# Patient Record
Sex: Male | Born: 1955 | Race: White | Hispanic: No | Smoking: Current every day smoker
Health system: Southern US, Community
[De-identification: ages and names within clinical notes are randomized; demographics above are authoritative.]

## PROBLEM LIST (undated history)

## (undated) HISTORY — PX: DENTAL SURGERY: SHX609

---

## 2011-11-12 ENCOUNTER — Emergency Department (HOSPITAL_COMMUNITY)
Admission: EM | Admit: 2011-11-12 | Discharge: 2011-11-12 | Disposition: A | Discharge status: Home / Self Care | Payer: Self-pay | Attending: Emergency Medicine | Admitting: Emergency Medicine

## 2011-11-12 ENCOUNTER — Emergency Department (HOSPITAL_COMMUNITY): Payer: Self-pay

## 2011-11-12 DIAGNOSIS — R109 Unspecified abdominal pain: Secondary | ICD-10-CM | POA: Insufficient documentation

## 2011-11-12 DIAGNOSIS — R3911 Hesitancy of micturition: Secondary | ICD-10-CM | POA: Insufficient documentation

## 2011-11-12 DIAGNOSIS — N201 Calculus of ureter: Secondary | ICD-10-CM | POA: Insufficient documentation

## 2011-11-12 DIAGNOSIS — N289 Disorder of kidney and ureter, unspecified: Secondary | ICD-10-CM | POA: Insufficient documentation

## 2011-11-12 DIAGNOSIS — F17200 Nicotine dependence, unspecified, uncomplicated: Secondary | ICD-10-CM | POA: Insufficient documentation

## 2011-11-12 LAB — COMPREHENSIVE METABOLIC PANEL
Alkaline Phosphatase: 68 U/L (ref 39–117)
BUN: 17 mg/dL (ref 6–23)
CO2: 26 mEq/L (ref 19–32)
Chloride: 101 mEq/L (ref 96–112)
GFR calc Af Amer: 66 mL/min — ABNORMAL LOW (ref >90)
Glucose, Bld: 109 mg/dL — ABNORMAL HIGH (ref 70–99)
Potassium: 4 mEq/L (ref 3.5–5.1)
Total Bilirubin: 0.5 mg/dL (ref 0.3–1.2)

## 2011-11-12 LAB — URINALYSIS, ROUTINE W REFLEX MICROSCOPIC
Bilirubin Urine: NEGATIVE
Ketones, ur: NEGATIVE mg/dL
Nitrite: NEGATIVE
Urobilinogen, UA: 1 mg/dL (ref 0.0–1.0)
pH: 5.5 (ref 5.0–8.0)

## 2011-11-12 LAB — URINE MICROSCOPIC-ADD ON

## 2011-11-12 LAB — DIFFERENTIAL
Lymphocytes Relative: 14 % (ref 12–46)
Lymphs Abs: 1.4 10*3/uL (ref 0.7–4.0)
Monocytes Relative: 9 % (ref 3–12)
Neutro Abs: 7.2 10*3/uL (ref 1.7–7.7)
Neutrophils Relative %: 74 % (ref 43–77)

## 2011-11-12 LAB — LIPASE, BLOOD: Lipase: 17 U/L (ref 11–59)

## 2011-11-12 LAB — CBC
Hemoglobin: 14.5 g/dL (ref 13.0–17.0)
MCH: 26.4 pg (ref 26.0–34.0)
RBC: 5.49 MIL/uL (ref 4.22–5.81)

## 2011-11-12 MED ORDER — TAMSULOSIN HCL 0.4 MG PO CAPS: 0.4000 mg | ORAL_CAPSULE | Freq: Every day | ORAL | Status: DC

## 2011-11-12 MED ORDER — OXYCODONE-ACETAMINOPHEN 5-325 MG PO TABS: 1.0000 | ORAL_TABLET | ORAL | Status: AC | PRN

## 2011-11-12 MED ORDER — ONDANSETRON HCL 4 MG/2ML IJ SOLN
4.0000 mg | Freq: Once | INTRAMUSCULAR | Status: AC
  Administered 2011-11-12: 4 mg via INTRAVENOUS
  Filled 2011-11-12: qty 2

## 2011-11-12 MED ORDER — METOCLOPRAMIDE HCL 10 MG PO TABS: 10.0000 mg | ORAL_TABLET | Freq: Four times a day (QID) | ORAL | Status: AC | PRN

## 2011-11-12 MED ORDER — HYDROMORPHONE HCL PF 1 MG/ML IJ SOLN
1.0000 mg | Freq: Once | INTRAMUSCULAR | Status: AC
  Administered 2011-11-12: 1 mg via INTRAVENOUS
  Filled 2011-11-12: qty 1

## 2011-11-12 MED ORDER — KETOROLAC TROMETHAMINE 30 MG/ML IJ SOLN
30.0000 mg | Freq: Once | INTRAMUSCULAR | Status: AC
  Administered 2011-11-12: 30 mg via INTRAVENOUS
  Filled 2011-11-12: qty 1

## 2011-11-12 MED ORDER — SODIUM CHLORIDE 0.9 % IV SOLN
Freq: Once | INTRAVENOUS | Status: AC
  Administered 2011-11-12: 03:00:00 via INTRAVENOUS

## 2011-11-12 MED ORDER — NAPROXEN 500 MG PO TABS: 500.0000 mg | ORAL_TABLET | Freq: Two times a day (BID) | ORAL | Status: DC

## 2011-11-12 NOTE — ED Notes (Signed)
Pt unable to urinate at this time. Will continue to monitor.

## 2011-11-12 NOTE — ED Notes (Signed)
Pt stated that he came to the ED because he was having RLQ abdominal pain, radiating to R Lower back. Pt states pain is 8 out of 10. Pt stated that he has been burning while urinating , and LBM is unknown. Bowel sounds heard in all quadrants. No rebound tenderness. No nausea/vomiting. Will continue to monitor.

## 2011-11-12 NOTE — ED Notes (Signed)
Patient currently sitting up in bed; no respiratory or acute distress noted.  Lights turned off, per patient request.  Updated patient on plan of care; informed patient we are waiting on urinalysis results to come back.  Patient has no other questions or concerns at this time.  Will continue to monitor.

## 2011-11-12 NOTE — ED Notes (Signed)
The patient states that he was having right flank pain radiating to the RLQ not related to any strenuous activity.  Patient states that he has no personal hx of kidney stones, however he does have a family hx.

## 2011-11-12 NOTE — ED Notes (Signed)
Dr. Glick at bedside.  

## 2011-11-12 NOTE — ED Notes (Signed)
Patient currently sitting up in bed; no respiratory or acute distress noted.  Patient updated on plan of care; informed patient that we are currently waiting for lab results to come back and that patient is going to be transported to CT.  Patient has no other questions or concerns at this time.  Will continue to monitor.

## 2011-11-12 NOTE — ED Notes (Signed)
Patient given discharge paperwork; went over discharge instructions with patient.  Instructed patient to follow up with Urology, to have creatinine rechecked in 2-4 weeks, to increase fluid intake, and to take prescriptions as prescribed.  Patient instructed to return to the ED for new, worsening, or concerning symptoms.

## 2011-11-12 NOTE — ED Notes (Signed)
Placed patient on 2 liters O2; O2 increased from 88 to 100%.

## 2011-11-12 NOTE — ED Notes (Signed)
Patient transported to CT 

## 2011-11-12 NOTE — ED Notes (Signed)
Informed patient that we need a urine sample; patient states that he is unable to go at this time.

## 2011-11-12 NOTE — ED Notes (Signed)
Patient currently asleep in bed; no respiratory or acute distress noted.  Will continue to monitor. 

## 2011-11-12 NOTE — ED Notes (Signed)
Patient currently sitting up in bed; no respiratory or acute distress noted.  Patient updated on plan of care; informed patient that he is up for discharge and that we are currently waiting on discharge paperwork from [facility]P.  Patient has no other questions or concerns at this time.  Will continue to monitor.

## 2011-11-12 NOTE — ED Notes (Signed)
Pt stated that he was unable to give a urine sample at this time.

## 2011-11-12 NOTE — ED Provider Notes (Signed)
History     CSN: [id number]  Arrival date & time 11/12/11  0122   First MD Initiated Contact with Patient 11/12/11 [phone]      Chief Complaint  Patient presents with  . Abdominal Pain  . Flank Pain    (Consider location/radiation/quality/duration/timing/severity/associated sxs/prior treatment) HPI 55 year old male had onset at about 1 PM of pain in the right lower abdomen which has radiated up to the right flank. Pain is severe and he rates it at 8/10. Nothing makes it better and nothing makes it worse. He denies any nausea or vomiting. He denies any fever, chills, sweats. He has had some urinary hesitancy but no dysuria and no obvious hematuria. He has never had pain like this before. Pain is described as sharp. He is taken ibuprofen which has given him slight relief of pain. No past medical history on file.  Past Surgical History  Procedure Date  . Dental surgery     Family History  Problem Relation Age of Onset  . Parkinsonism Mother   . Diabetes Father   . Cholelithiasis Other     History  Substance Use Topics  . Smoking status: Current Everyday Smoker -- 0.5 packs/day for 20 years    Types: Cigarettes  . Smokeless tobacco: Not on file  . Alcohol Use: Yes      Review of Systems  Allergies  Review of patient's allergies indicates not on file.  Home Medications  No current outpatient prescriptions on file.  BP 146/90  Pulse 89  Temp(Src) 97.7 F (36.5 C) (Oral)  Resp 18  Ht 5\' 9"  (1.753 m)  Wt 150 lb (68.04 kg)  BMI 22.15 kg/m2  SpO2 99%  Physical Exam 54 year old male who is resting comfortably and is in no acute distress. Vital signs are significant for borderline hypertension with blood pressure 146/90. Oxygen saturation is 99% which is normal. Head is normocephalic and atraumatic. PERRLA, EOMI. Neck is supple without adenopathy. The lungs are clear without rales, wheezes, rhonchi. Heart has regular rate and rhythm without murmur. Abdomen is soft,  flat, with mild tenderness in the right lower cartilage and without any rebound or guarding. Peristalsis is diminished. There is mild right CVA tenderness. Extremities have no cyanosis or edema, full range of motion is present. Neurologic: Mental status is normal, cranial nerves are intact, there are no focal motor or sensory deficits. Psychiatric: No abnormalities of mood or affect. ED Course  Procedures (including critical care time)   Labs Reviewed  CBC  DIFFERENTIAL  COMPREHENSIVE METABOLIC PANEL  LIPASE, BLOOD  URINALYSIS, ROUTINE W REFLEX MICROSCOPIC   No results found. Results for orders placed during the hospital encounter of 11/12/11  CBC      Component Value Range   WBC 9.7  4.0 - 10.5 (K/uL)   RBC 5.49  4.22 - 5.81 (MIL/uL)   Hemoglobin 14.5  13.0 - 17.0 (g/dL)   HCT 86.5  78.4 - 69.6 (%)   MCV 78.3  78.0 - 100.0 (fL)   MCH 26.4  26.0 - 34.0 (pg)   MCHC 33.7  30.0 - 36.0 (g/dL)   RDW 29.5  28.4 - 13.2 (%)   Platelets 223  150 - 400 (K/uL)  DIFFERENTIAL      Component Value Range   Neutrophils Relative 74  43 - 77 (%)   Neutro Abs 7.2  1.7 - 7.7 (K/uL)   Lymphocytes Relative 14  12 - 46 (%)   Lymphs Abs 1.4  0.7 - 4.0 (K/uL)  Monocytes Relative 9  3 - 12 (%)   Monocytes Absolute 0.9  0.1 - 1.0 (K/uL)   Eosinophils Relative 2  0 - 5 (%)   Eosinophils Absolute 0.2  0.0 - 0.7 (K/uL)   Basophils Relative 0  0 - 1 (%)   Basophils Absolute 0.0  0.0 - 0.1 (K/uL)  COMPREHENSIVE METABOLIC PANEL      Component Value Range   Sodium 136  135 - 145 (mEq/L)   Potassium 4.0  3.5 - 5.1 (mEq/L)   Chloride 101  96 - 112 (mEq/L)   CO2 26  19 - 32 (mEq/L)   Glucose, Bld 109 (*) 70 - 99 (mg/dL)   BUN 17  6 - 23 (mg/dL)   Creatinine, Ser 1.61 (*) 0.50 - 1.35 (mg/dL)   Calcium 9.3  8.4 - 09.6 (mg/dL)   Total Protein 6.4  6.0 - 8.3 (g/dL)   Albumin 3.6  3.5 - 5.2 (g/dL)   AST 22  0 - 37 (U/L)   ALT 17  0 - 53 (U/L)   Alkaline Phosphatase 68  39 - 117 (U/L)   Total Bilirubin 0.5   0.3 - 1.2 (mg/dL)   GFR calc non Af Amer 57 (*) >90 (mL/min)   GFR calc Af Amer 66 (*) >90 (mL/min)  LIPASE, BLOOD      Component Value Range   Lipase 17  11 - 59 (U/L)  URINALYSIS, ROUTINE W REFLEX MICROSCOPIC      Component Value Range   Color, Urine YELLOW  YELLOW    APPearance TURBID (*) CLEAR    Specific Gravity, Urine 1.029  1.005 - 1.030    pH 5.5  5.0 - 8.0    Glucose, UA NEGATIVE  NEGATIVE (mg/dL)   Hgb urine dipstick MODERATE (*) NEGATIVE    Bilirubin Urine NEGATIVE  NEGATIVE    Ketones, ur NEGATIVE  NEGATIVE (mg/dL)   Protein, ur NEGATIVE  NEGATIVE (mg/dL)   Urobilinogen, UA 1.0  0.0 - 1.0 (mg/dL)   Nitrite NEGATIVE  NEGATIVE    Leukocytes, UA NEGATIVE  NEGATIVE   URINE MICROSCOPIC-ADD ON      Component Value Range   Squamous Epithelial / LPF RARE  RARE    WBC, UA 3-6  <3 (WBC/hpf)   RBC / HPF 3-6  <3 (RBC/hpf)   Bacteria, UA FEW (*) RARE    Urine-Other MUCOUS PRESENT     Ct Abdomen Pelvis Wo Contrast  11/12/2011  *RADIOLOGY REPORT*  Clinical Data: Right flank pain, radiating to the right lower quadrant of the abdomen.  CT ABDOMEN AND PELVIS WITHOUT CONTRAST  Technique:  Multidetector CT imaging of the abdomen and pelvis was performed following the standard protocol without intravenous contrast.  Comparison: None.  Findings: Minimal bibasilar atelectasis is noted.  The liver and spleen are unremarkable in appearance.  The gallbladder is within normal limits.  The pancreas and adrenal glands are unremarkable.  There is mild right-sided hydronephrosis, with right-sided perinephric stranding and fluid, and prominence of the right ureter to the level of an obstructing distal stone measuring 4 x 2 mm at the right vesicoureteral junction.  The left kidney is unremarkable in appearance.  No nonobstructing renal stones are seen.  No free fluid is identified.  The small bowel is unremarkable in appearance.  The stomach is within normal limits.  No acute vascular abnormalities are  seen.  The appendix is normal in caliber and contains air, without evidence for appendicitis.  Scattered diverticulosis is noted along  the distal descending and proximal sigmoid colon, without evidence of diverticulitis.  The bladder is mildly distended; it appears mildly thick-walled, possibly reflecting mild cystitis.  The prostate remains normal in size.  A small right inguinal hernia is noted, containing only fat. The left testis is superiorly positioned, seen along the inferior inguinal canal.  No inguinal lymphadenopathy is seen.  No acute osseous abnormalities are identified.  IMPRESSION:  1.  Mild right-sided hydronephrosis, with right-sided perinephric stranding and fluid, and an obstructing 4 x 2 mm stone noted distally at the right vesicoureteral junction. 2.  Mildly thick-walled appearance of the bladder may reflect mild cystitis. 3.  Superior positioning of the left testis, seen along the inferior inguinal canal.  Suggest clinical correlation for incompletely descended testis versus transient retraction. 4.  Scattered diverticulosis along the distal descending and proximal sigmoid colon, without evidence of diverticulitis. 5.  Small right inguinal hernia, containing only fat.  Original Report Authenticated By: Tonia Ghent, M.D.      No diagnosis found.  He was given IV fluid, IV Dilaudid, IV Zofran, and IV Toradol with excellent relief of pain. His CT scan confirms a distal right ureteral calculus, but he also has a mild increase in his creatinine. He is advised of the creatinine and need to get followup blood work. He will be sent home with prescriptions for Percocet, Reglan, naproxen, and Flomax.  MDM  Right flank and right lower abdomen pain-probable renal colic.        Dione Booze, MD 11/12/11 [phone]

## 2011-11-12 NOTE — ED Notes (Signed)
Woke up patient to inform him that we need a urine sample; patient states that he will try.

## 2012-01-29 ENCOUNTER — Emergency Department: Payer: Self-pay | Admitting: Unknown Physician Specialty

## 2012-01-29 LAB — BASIC METABOLIC PANEL
Anion Gap: 11 (ref 7–16)
Chloride: 103 mmol/L (ref 98–107)
Co2: 26 mmol/L (ref 21–32)
Creatinine: 0.83 mg/dL (ref 0.60–1.30)

## 2012-01-29 LAB — CBC WITH DIFFERENTIAL/PLATELET
Eosinophil %: 0.9 %
HGB: 15.7 g/dL (ref 13.0–18.0)
Lymphocyte #: 1.3 10*3/uL (ref 1.0–3.6)
MCV: 80 fL (ref 80–100)
Monocyte %: 5.9 %
Neutrophil #: 8.4 10*3/uL — ABNORMAL HIGH (ref 1.4–6.5)
RBC: 5.96 10*6/uL — ABNORMAL HIGH (ref 4.40–5.90)

## 2012-05-19 ENCOUNTER — Emergency Department (HOSPITAL_COMMUNITY)
Admission: EM | Admit: 2012-05-19 | Discharge: 2012-05-20 | Disposition: A | Discharge status: Home / Self Care | Payer: BC Managed Care – PPO | Attending: Emergency Medicine | Admitting: Emergency Medicine

## 2012-05-19 ENCOUNTER — Encounter (HOSPITAL_COMMUNITY): Payer: Self-pay | Admitting: *Deleted

## 2012-05-19 DIAGNOSIS — M7989 Other specified soft tissue disorders: Secondary | ICD-10-CM | POA: Insufficient documentation

## 2012-05-19 DIAGNOSIS — E119 Type 2 diabetes mellitus without complications: Secondary | ICD-10-CM | POA: Insufficient documentation

## 2012-05-19 DIAGNOSIS — G20 Parkinson's disease: Secondary | ICD-10-CM | POA: Insufficient documentation

## 2012-05-19 DIAGNOSIS — G20A1 Parkinson's disease without dyskinesia, without mention of fluctuations: Secondary | ICD-10-CM | POA: Insufficient documentation

## 2012-05-19 LAB — CBC WITH DIFFERENTIAL/PLATELET
Basophils Absolute: 0 10*3/uL (ref 0.0–0.1)
Eosinophils Relative: 4 % (ref 0–5)
Lymphocytes Relative: 24 % (ref 12–46)
Lymphs Abs: 2.1 10*3/uL (ref 0.7–4.0)
MCV: 77.4 fL — ABNORMAL LOW (ref 78.0–100.0)
Neutro Abs: 5.5 10*3/uL (ref 1.7–7.7)
Platelets: 232 10*3/uL (ref 150–400)
RBC: 5.54 MIL/uL (ref 4.22–5.81)
WBC: 8.6 10*3/uL (ref 4.0–10.5)

## 2012-05-19 LAB — COMPREHENSIVE METABOLIC PANEL
ALT: 16 U/L (ref 0–53)
AST: 17 U/L (ref 0–37)
Alkaline Phosphatase: 76 U/L (ref 39–117)
CO2: 26 mEq/L (ref 19–32)
Calcium: 9.2 mg/dL (ref 8.4–10.5)
Chloride: 104 mEq/L (ref 96–112)
GFR calc Af Amer: >90 mL/min (ref >90)
GFR calc non Af Amer: >90 mL/min (ref >90)
Glucose, Bld: 118 mg/dL — ABNORMAL HIGH (ref 70–99)
Potassium: 4.1 mEq/L (ref 3.5–5.1)
Sodium: 141 mEq/L (ref 135–145)
Total Bilirubin: 0.4 mg/dL (ref 0.3–1.2)

## 2012-05-19 NOTE — ED Notes (Signed)
The pt has pain and swelling in his rt lower leg since yesterday.  noknown injury.  The leg is larger then the lt leg and sl a darker color than the lt one..  No previous history

## 2012-05-20 ENCOUNTER — Ambulatory Visit (HOSPITAL_COMMUNITY)
Admission: RE | Admit: 2012-05-20 | Discharge: 2012-05-20 | Disposition: A | Discharge status: Home / Self Care | Payer: BC Managed Care – PPO | Source: Ambulatory Visit | Attending: Emergency Medicine | Admitting: Emergency Medicine

## 2012-05-20 ENCOUNTER — Emergency Department (HOSPITAL_COMMUNITY): Payer: BC Managed Care – PPO

## 2012-05-20 ENCOUNTER — Emergency Department (HOSPITAL_COMMUNITY)
Admission: EM | Admit: 2012-05-20 | Discharge: 2012-05-20 | Disposition: A | Discharge status: Home / Self Care | Payer: BC Managed Care – PPO | Attending: Emergency Medicine | Admitting: Emergency Medicine

## 2012-05-20 ENCOUNTER — Encounter (HOSPITAL_COMMUNITY): Payer: Self-pay | Admitting: *Deleted

## 2012-05-20 DIAGNOSIS — R599 Enlarged lymph nodes, unspecified: Secondary | ICD-10-CM | POA: Insufficient documentation

## 2012-05-20 DIAGNOSIS — M25469 Effusion, unspecified knee: Secondary | ICD-10-CM | POA: Insufficient documentation

## 2012-05-20 DIAGNOSIS — M7989 Other specified soft tissue disorders: Secondary | ICD-10-CM

## 2012-05-20 DIAGNOSIS — S93409A Sprain of unspecified ligament of unspecified ankle, initial encounter: Secondary | ICD-10-CM | POA: Insufficient documentation

## 2012-05-20 DIAGNOSIS — M79609 Pain in unspecified limb: Secondary | ICD-10-CM

## 2012-05-20 DIAGNOSIS — X58XXXA Exposure to other specified factors, initial encounter: Secondary | ICD-10-CM | POA: Insufficient documentation

## 2012-05-20 DIAGNOSIS — M79606 Pain in leg, unspecified: Secondary | ICD-10-CM

## 2012-05-20 MED ORDER — PREDNISONE 20 MG PO TABS
60.0000 mg | ORAL_TABLET | Freq: Once | ORAL | Status: AC
  Administered 2012-05-20: 60 mg via ORAL
  Filled 2012-05-20: qty 3

## 2012-05-20 MED ORDER — OXYCODONE-ACETAMINOPHEN 5-325 MG PO TABS: 1.0000 | ORAL_TABLET | Freq: Four times a day (QID) | ORAL | Status: AC | PRN

## 2012-05-20 MED ORDER — ENOXAPARIN SODIUM 100 MG/ML ~~LOC~~ SOLN
1.0000 mg/kg | Freq: Once | SUBCUTANEOUS | Status: AC
  Administered 2012-05-20: 70 mg via SUBCUTANEOUS
  Filled 2012-05-20 (×3): qty 1

## 2012-05-20 MED ORDER — PREDNISONE 10 MG PO TABS: 50.0000 mg | ORAL_TABLET | Freq: Every day | ORAL | Status: DC

## 2012-05-20 MED ORDER — OXYCODONE-ACETAMINOPHEN 5-325 MG PO TABS
2.0000 | ORAL_TABLET | Freq: Once | ORAL | Status: AC
  Administered 2012-05-20: 2 via ORAL
  Filled 2012-05-20 (×2): qty 2

## 2012-05-20 MED ORDER — HYDROCODONE-ACETAMINOPHEN 5-500 MG PO TABS: 1.0000 | ORAL_TABLET | Freq: Four times a day (QID) | ORAL | Status: DC | PRN

## 2012-05-20 NOTE — Discharge Instructions (Signed)
Return to the hospital later this morning as directed for your outpatient study to test for blood clots

## 2012-05-20 NOTE — ED Notes (Signed)
Ortho at bedside applying knee sleeve and ankle splint.

## 2012-05-20 NOTE — ED Provider Notes (Signed)
History     CSN: [id number]  Arrival date & time 05/20/12  9147   First MD Initiated Contact with Patient 05/20/12 [phone]      Chief Complaint  Patient presents with  . Leg Pain    (Consider location/radiation/quality/duration/timing/severity/associated sxs/prior treatment) HPI  She presents to the emergency department for right knee and right ankle pain. He was seen in the emergency department last night for the right knee pain and a Doppler study was ordered to rule out DVT. The patient returns this morning for his Doppler study in the test was negative. He checked back in because he's had continued swelling and pain in the knee and now swelling, bruising, pain in the right ankle. The patient is a poor strainer and rides horses. He states that he did have an event this past weekend where he is up on his feet all day but does not remembering injuring himself. He states that the pain in his knees severe. He's never had any problems like this before and he does not have any pains in any other joints besides the ones listed above. He has not had any fevers, chills, nausea, vomiting, diarrhea. The patient's vital signs are stable he is in no acute distress at this time.  History reviewed. No pertinent past medical history.  Past Surgical History  Procedure Date  . Dental surgery     Family History  Problem Relation Age of Onset  . Parkinsonism Mother   . Diabetes Father   . Cholelithiasis Other     History  Substance Use Topics  . Smoking status: Current Everyday Smoker -- 0.5 packs/day for 20 years    Types: Cigarettes  . Smokeless tobacco: Not on file  . Alcohol Use: Yes      Review of Systems   HEENT: denies blurry vision or change in hearing PULMONARY: Denies difficulty breathing and SOB CARDIAC: denies chest pain or heart palpitations MUSCULOSKELETAL:  denies being unable to ambulate ABDOMEN AL: denies abdominal pain GU: denies loss of bowel or urinary  control NEURO: denies numbness and tingling in extremities SKIN: no new rashes PSYCH: patient denies anxiety or depression. NECK: Pt denies having neck pain     Allergies  Review of patient's allergies indicates no known allergies.  Home Medications   Current Outpatient Rx  Name Route Sig Dispense Refill  . HYDROCODONE-ACETAMINOPHEN 5-500 MG PO TABS Oral Take 1 tablet by mouth every 6 (six) hours as needed for pain. 12 tablet 0  . PREDNISONE 10 MG PO TABS Oral Take 5 tablets (50 mg total) by mouth daily. 5 tablet 0    BP 147/94  Pulse 114  Temp 98.5 F (36.9 C) (Oral)  Resp 18  Ht 5\' 9"  (1.753 m)  Wt 155 lb (70.308 kg)  BMI 22.89 kg/m2  SpO2 100%  Physical Exam  Nursing note and vitals reviewed. Constitutional: He appears well-developed and well-nourished. No distress.  HENT:  Head: Normocephalic and atraumatic.  Eyes: Pupils are equal, round, and reactive to light.  Neck: Normal range of motion. Neck supple.  Cardiovascular: Normal rate and regular rhythm.   Pulmonary/Chest: Effort normal.  Abdominal: Soft.  Musculoskeletal:       Right knee: He exhibits effusion. He exhibits normal range of motion, no swelling, no ecchymosis, no deformity, no laceration, no erythema, normal alignment, no LCL laxity and normal patellar mobility. tenderness found. Patellar tendon tenderness noted.       Right ankle: He exhibits swelling and ecchymosis. He exhibits  normal range of motion, no deformity, no laceration and normal pulse. tenderness. Achilles tendon normal.       Legs:      Medium knee effusion to left knee  Left ankle has significant ecchymosis and swelling. Good pulses bilaterally.   Neurological: He is alert.  Skin: Skin is warm and dry.    ED Course  Procedures (including critical care time)  Labs Reviewed - No data to display Dg Ankle Complete Right  05/20/2012  *RADIOLOGY REPORT*  Clinical Data: Right ankle bruising and swelling medially.  No known injury per  patient.  RIGHT ANKLE - COMPLETE 3+ VIEW  Comparison: None.  Findings: Medial soft tissue swelling.  No evidence of acute, subacute, or healed fractures.  Ankle mortise intact with well- preserved joint space.  No intrinsic osseous abnormalities.  No evidence of a significant joint effusion.  IMPRESSION: No osseous abnormality.  Original Report Authenticated By: Arnell Sieving, M.D.   Dg Knee Complete 4 Views Right  05/20/2012  *RADIOLOGY REPORT*  Clinical Data: Bruising and swelling to the right knee.  No known injuries per patient.  RIGHT KNEE - COMPLETE 4+ VIEW  Comparison: None.  Findings: No evidence of acute or subacute fracture or dislocation. Very mild medial compartment joint space narrowing.  Lateral and patellofemoral compartment joint spaces well preserved.  No intrinsic osseous abnormalities.  Small joint effusion.  IMPRESSION: No acute or subacute osseous abnormality.  Very mild medial compartment joint space narrowing.  Small joint effusion.  Original Report Authenticated By: Arnell Sieving, M.D.     1. Knee effusion   2. Ankle sprain       MDM  Pt xrays are none acute. Knee xray does show small effusion.  Pts pain most likely caused by injury although unknown. I have expressed how important it is for patient to follow-up with the Orthopedist referred to him for these injuries. He promises he will follow up. Pt given knee sleeve and ASO for ankle. He already as crutches. Prednisone 50mg  Tab given in ED for swelling, as well as percocet. Pt also given Rx for prednisone and Vicodin.  Pt has been advised of the symptoms that warrant their return to the ED. Patient has voiced understanding and has agreed to follow-up with the PCP or specialist.         Dorthula Matas, PA 05/20/12 1101

## 2012-05-20 NOTE — ED Notes (Signed)
Patient reports onset of pain in his right knee on Saturday.  He was seen here for same last night.  He returned for doppler study and wants to be rechecked due to increased pain and swelling in the knee.  He denies trauma

## 2012-05-20 NOTE — Discharge Instructions (Signed)
Ankle Sprain An ankle sprain is an injury to the strong, fibrous tissues (ligaments) that hold the bones of your ankle joint together.  CAUSES Ankle sprain usually is caused by a fall or by twisting your ankle. People who participate in sports are more prone to these types of injuries.  SYMPTOMS  Symptoms of ankle sprain include:  Pain in your ankle. The pain may be present at rest or only when you are trying to stand or walk.   Swelling.   Bruising. Bruising may develop immediately or within 1 to 2 days after your injury.   Difficulty standing or walking.  DIAGNOSIS  Your caregiver will ask you details about your injury and perform a physical exam of your ankle to determine if you have an ankle sprain. During the physical exam, your caregiver will press and squeeze specific areas of your foot and ankle. Your caregiver will try to move your ankle in certain ways. An X-ray exam may be done to be sure a bone was not broken or a ligament did not separate from one of the bones in your ankle (avulsion).  TREATMENT  Certain types of braces can help stabilize your ankle. Your caregiver can make a recommendation for this. Your caregiver may recommend the use of medication for pain. If your sprain is severe, your caregiver may refer you to a surgeon who helps to restore function to parts of your skeletal system (orthopedist) or a physical therapist. HOME CARE INSTRUCTIONS  Apply ice to your injury for 1 to 2 days or as directed by your caregiver. Applying ice helps to reduce inflammation and pain.  Put ice in a plastic bag.   Place a towel between your skin and the bag.   Leave the ice on for 15 to 20 minutes at a time, every 2 hours while you are awake.   Take over-the-counter or prescription medicines for pain, discomfort, or fever only as directed by your caregiver.   Keep your injured leg elevated, when possible, to lessen swelling.   If your caregiver recommends crutches, use them as  instructed. Gradually, put weight on the affected ankle. Continue to use crutches or a cane until you can walk without feeling pain in your ankle.   If you have a plaster splint, wear the splint as directed by your caregiver. Do not rest it on anything harder than a pillow the first 24 hours. Do not put weight on it. Do not get it wet. You may take it off to take a shower or bath.   You may have been given an elastic bandage to wear around your ankle to provide support. If the elastic bandage is too tight (you have numbness or tingling in your foot or your foot becomes cold and blue), adjust the bandage to make it comfortable.   If you have an air splint, you may blow more air into it or let air out to make it more comfortable. You may take your splint off at night and before taking a shower or bath.   Wiggle your toes in the splint several times per day if you are able.  SEEK MEDICAL CARE IF:   You have an increase in bruising, swelling, or pain.   Your toes feel cold.   Pain relief is not achieved with medication.  SEEK IMMEDIATE MEDICAL CARE IF: Your toes are numb or blue or you have severe pain. MAKE SURE YOU:   Understand these instructions.   Will watch your condition.     Will get help right away if you are not doing well or get worse.  Document Released: 11/05/2005 Document Revised: 10/25/2011 Document Reviewed: 06/09/2008 [facility] Patient Information 2012 [place], [place].Knee Effusion The medical term for having fluid in your knee is effusion. This is often due to an internal derangement of the knee. This means something is wrong inside the knee. Some of the causes of fluid in the knee may be torn cartilage, a torn ligament, or bleeding into the joint from an injury. Your knee is likely more difficult to bend and move. This is often because there is increased pain and pressure in the joint. The time it takes for recovery from a knee effusion depends on different factors, including:     Type of injury.   Your age.   Physical and medical conditions.   Rehabilitation Strategies.  How long you will be away from your normal activities will depend on what kind of knee problem you have and how much damage is present. Your knee has two types of cartilage. Articular cartilage covers the bone ends and lets your knee bend and move smoothly. Two menisci, thick pads of cartilage that form a rim inside the joint, help absorb shock and stabilize your knee. Ligaments bind the bones together and support your knee joint. Muscles move the joint, help support your knee, and take stress off the joint itself. CAUSES  Often an effusion in the knee is caused by an injury to one of the menisci. This is often a tear in the cartilage. Recovery after a meniscus injury depends on how much meniscus is damaged and whether you have damaged other knee tissue. Small tears may heal on their own with conservative treatment. Conservative means rest, limited weight bearing activity and muscle strengthening exercises. Your recovery may take up to 6 weeks.  TREATMENT  Larger tears may require surgery. Meniscus injuries may be treated during arthroscopy. Arthroscopy is a procedure in which your surgeon uses a small telescope like instrument to look in your knee. Your caregiver can make a more accurate diagnosis (learning what is wrong) by performing an arthroscopic procedure. If your injury is on the inner margin of the meniscus, your surgeon may trim the meniscus back to a smooth rim. In other cases your surgeon will try to repair a damaged meniscus with stitches (sutures). This may make rehabilitation take longer, but may provide better long term result by helping your knee keep its shock absorption capabilities. Ligaments which are completely torn usually require surgery for repair. HOME CARE INSTRUCTIONS  Use crutches as instructed.   If a brace is applied, use as directed.   Once you are home, an ice pack  applied to your swollen knee may help with discomfort and help decrease swelling.   Keep your knee raised (elevated) when you are not up and around or on crutches.   Only take over-the-counter or prescription medicines for pain, discomfort, or fever as directed by your caregiver.   Your caregivers will help with instructions for rehabilitation of your knee. This often includes strengthening exercises.   You may resume a normal diet and activities as directed.  SEEK MEDICAL CARE IF:   There is increased swelling in your knee.   You notice redness, swelling, or increasing pain in your knee.   An unexplained oral temperature above 102 F (38.9 C) develops.  SEEK IMMEDIATE MEDICAL CARE IF:   You develop a rash.   You have difficulty breathing.   You have any allergic  reactions from medications you may have been given.   There is severe pain with any motion of the knee.  MAKE SURE YOU:   Understand these instructions.   Will watch your condition.   Will get help right away if you are not doing well or get worse.  Document Released: 01/26/2004 Document Revised: 10/25/2011 Document Reviewed: 03/31/2008 [facility] Patient Information 2012 [place], [place].Ligament Sprain Ligaments are tough, fibrous tissues that hold bones together at the joints. A sprain can occur when a ligament is stretched. This injury may take several weeks to heal. HOME CARE INSTRUCTIONS   Rest the injured area for as long as directed by your caregiver. Then slowly start using the joint as directed by your caregiver and as the pain allows.   Keep the affected joint raised if possible to lessen swelling.   Apply ice for 15 to 20 minutes to the injured area every couple hours for the first half day, then 3 to 4 times per day for the first 48 hours. Put the ice in a plastic bag and place a towel between the bag of ice and your skin.   Wear any splinting, casting, or elastic bandage applications as instructed.    Only take over-the-counter or prescription medicines for pain, discomfort, or fever as directed by your caregiver. Do not use aspirin immediately after the injury unless instructed by your caregiver. Aspirin can cause increased bleeding and bruising of the tissues.   If you were given crutches, continue to use them as instructed and do not resume weight bearing on the affected extremity until instructed.  SEEK MEDICAL CARE IF:   Your bruising, swelling, or pain increases.   You have cold and numb fingers or toes if your arm or leg was injured.  SEEK IMMEDIATE MEDICAL CARE IF:   Your toes are numb or blue if your leg was injured.   Your fingers are numb or blue if your arm was injured.   Your pain is not responding to medicines and continues to stay the same or gets worse.  MAKE SURE YOU:   Understand these instructions.   Will watch your condition.   Will get help right away if you are not doing well or get worse.  Document Released: 11/02/2000 Document Revised: 10/25/2011 Document Reviewed: 08/31/2008 [facility] Patient Information 2012 [place], [place].

## 2012-05-20 NOTE — Progress Notes (Signed)
Right lower extremity venous duplex completed.  Preliminary report is negative for DVT, SVT, or a Baker's cyst in the right leg.  Negative for DVT in the left common femoral vein. 

## 2012-05-20 NOTE — ED Notes (Addendum)
Patient transported to X-ray by Ronne Binning, pt stable and ready for transport.

## 2012-05-20 NOTE — ED Provider Notes (Signed)
History     CSN: [id number]  Arrival date & time 05/19/12  2236   First MD Initiated Contact with Patient 05/20/12 0001      Chief Complaint  Patient presents with  . Leg Swelling    (Consider location/radiation/quality/duration/timing/severity/associated sxs/prior treatment) HPI Complains of right leg swelling onset yesterday with mild pain. Nothing makes symptoms better or worse. No injury no numbness no other associated symptoms discomfort is mild. No fever no other complaint History reviewed. No pertinent past medical history. Aspect history negative Past Surgical History  Procedure Date  . Dental surgery     Family History  Problem Relation Age of Onset  . Parkinsonism Mother   . Diabetes Father   . Cholelithiasis Other     History  Substance Use Topics  . Smoking status: Current Everyday Smoker -- 0.5 packs/day for 20 years    Types: Cigarettes  . Smokeless tobacco: Not on file  . Alcohol Use: Yes      Review of Systems  Constitutional: Negative.   HENT: Negative.   Respiratory: Negative.   Cardiovascular: Negative.   Gastrointestinal: Negative.   Musculoskeletal:       Swelling of right lower leg  Skin: Negative.   Neurological: Negative.   Hematological: Negative.   Psychiatric/Behavioral: Negative.     Allergies  Review of patient's allergies indicates no known allergies.  Home Medications  No current outpatient prescriptions on file.  BP 136/86  Pulse 104  Temp 98.4 F (36.9 C) (Oral)  Resp 18  SpO2 98%  Physical Exam  Nursing note and vitals reviewed. Constitutional: He appears well-developed and well-nourished.  HENT:  Head: Normocephalic and atraumatic.  Eyes: Conjunctivae are normal. Pupils are equal, round, and reactive to light.  Neck: Neck supple. No tracheal deviation present. No thyromegaly present.  Cardiovascular: Normal rate and regular rhythm.   No murmur heard. Pulmonary/Chest: Effort normal and breath sounds normal.    Abdominal: Soft. Bowel sounds are normal. He exhibits no distension. There is no tenderness.  Musculoskeletal: Normal range of motion. He exhibits edema. He exhibits no tenderness.       Right lower extremity ecchymotic at medial malleolus. Swollen at calf with minimal tenderness no cords no Homans sign DP pulse 2+  Neurological: He is alert. Coordination normal.       Gait normal  Skin: Skin is warm and dry. No rash noted.  Psychiatric: He has a normal mood and affect.    ED Course  Procedures (including critical care time)  Labs Reviewed  CBC WITH DIFFERENTIAL - Abnormal; Notable for the following:    MCV 77.4 (*)     MCH 25.8 (*)     All other components within normal limits  COMPREHENSIVE METABOLIC PANEL - Abnormal; Notable for the following:    Glucose, Bld 118 (*)     All other components within normal limits   No results found.   No diagnosis found.  Results for orders placed during the hospital encounter of 05/19/12  CBC WITH DIFFERENTIAL      Component Value Range   WBC 8.6  4.0 - 10.5 K/uL   RBC 5.54  4.22 - 5.81 MIL/uL   Hemoglobin 14.3  13.0 - 17.0 g/dL   HCT 78.2  95.6 - 21.3 %   MCV 77.4 (*) 78.0 - 100.0 fL   MCH 25.8 (*) 26.0 - 34.0 pg   MCHC 33.3  30.0 - 36.0 g/dL   RDW 08.6  57.8 - 46.9 %  Platelets 232  150 - 400 K/uL   Neutrophils Relative 64  43 - 77 %   Neutro Abs 5.5  1.7 - 7.7 K/uL   Lymphocytes Relative 24  12 - 46 %   Lymphs Abs 2.1  0.7 - 4.0 K/uL   Monocytes Relative 8  3 - 12 %   Monocytes Absolute 0.7  0.1 - 1.0 K/uL   Eosinophils Relative 4  0 - 5 %   Eosinophils Absolute 0.3  0.0 - 0.7 K/uL   Basophils Relative 0  0 - 1 %   Basophils Absolute 0.0  0.0 - 0.1 K/uL  COMPREHENSIVE METABOLIC PANEL      Component Value Range   Sodium 141  135 - 145 mEq/L   Potassium 4.1  3.5 - 5.1 mEq/L   Chloride 104  96 - 112 mEq/L   CO2 26  19 - 32 mEq/L   Glucose, Bld 118 (*) 70 - 99 mg/dL   BUN 13  6 - 23 mg/dL   Creatinine, Ser 1.61  0.50 -  1.35 mg/dL   Calcium 9.2  8.4 - 09.6 mg/dL   Total Protein 6.6  6.0 - 8.3 g/dL   Albumin 3.7  3.5 - 5.2 g/dL   AST 17  0 - 37 U/L   ALT 16  0 - 53 U/L   Alkaline Phosphatase 76  39 - 117 U/L   Total Bilirubin 0.4  0.3 - 1.2 mg/dL   GFR calc non Af Amer >90  >90 mL/min   GFR calc Af Amer >90  >90 mL/min   No results found.   MDM  No clinical signs of infection .-Clinically, DVT most likely diagnosis; no signs or symptoms of PE Plan Lovenox discharge Outpatient DVT study  Diagnosis swelling of right lower extremity         Doug Sou, MD 05/20/12 0454

## 2012-05-20 NOTE — ED Notes (Signed)
Discharged home with written and verbal instruction.  No questions or concerns at discharge. 

## 2012-05-20 NOTE — Progress Notes (Signed)
Orthopedic Tech Progress Note Patient Details:  Victor Berger November 30, 1955 [id number]  Ortho Devices Type of Ortho Device: Knee Sleeve;ASO Ortho Device/Splint Location: right LE Ortho Device/Splint Interventions: Application   Yosmar Ryker T 05/20/2012, 11:18 AM

## 2012-05-21 NOTE — ED Provider Notes (Signed)
Medical screening examination/treatment/procedure(s) were performed by non-physician practitioner and as supervising physician I was immediately available for consultation/collaboration.   Elenie Coven, MD 05/21/12 1315 

## 2012-07-09 ENCOUNTER — Emergency Department: Payer: Self-pay | Admitting: *Deleted

## 2012-07-30 ENCOUNTER — Emergency Department: Payer: Self-pay | Admitting: Emergency Medicine

## 2013-05-19 IMAGING — CR DG KNEE COMPLETE 4+V*R*
4 series · 4 of 4 positions shown · non-contrast
Comparison: None.

CLINICAL DATA: Bruising and swelling to the right knee.  No known
injuries per patient.

RIGHT KNEE - COMPLETE 4+ VIEW

[t knee ap right]
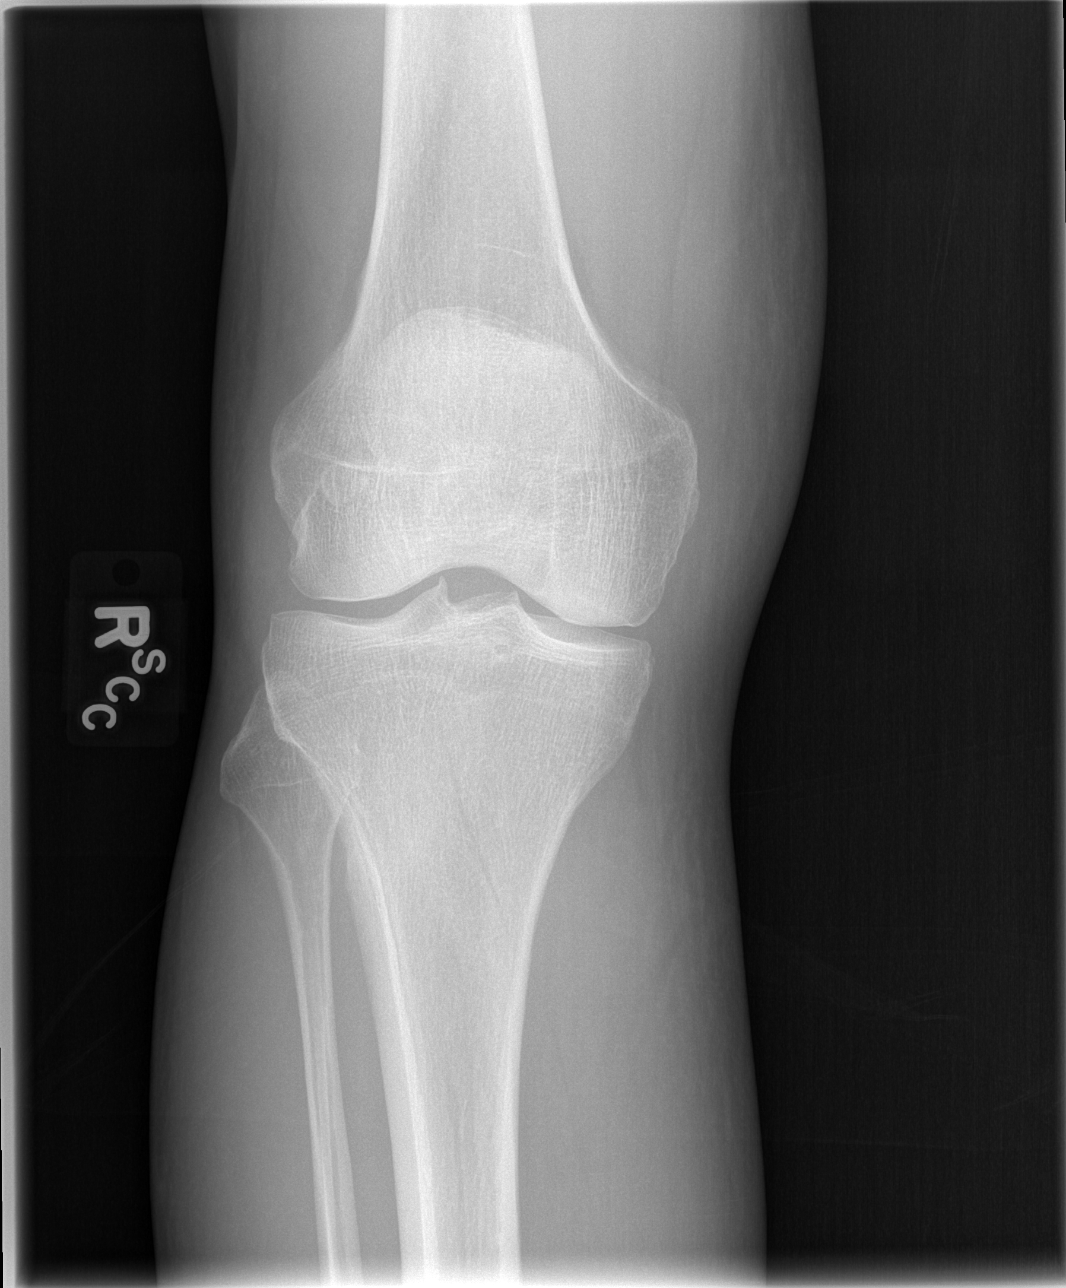

[t knee oblique right (1 of 2)]
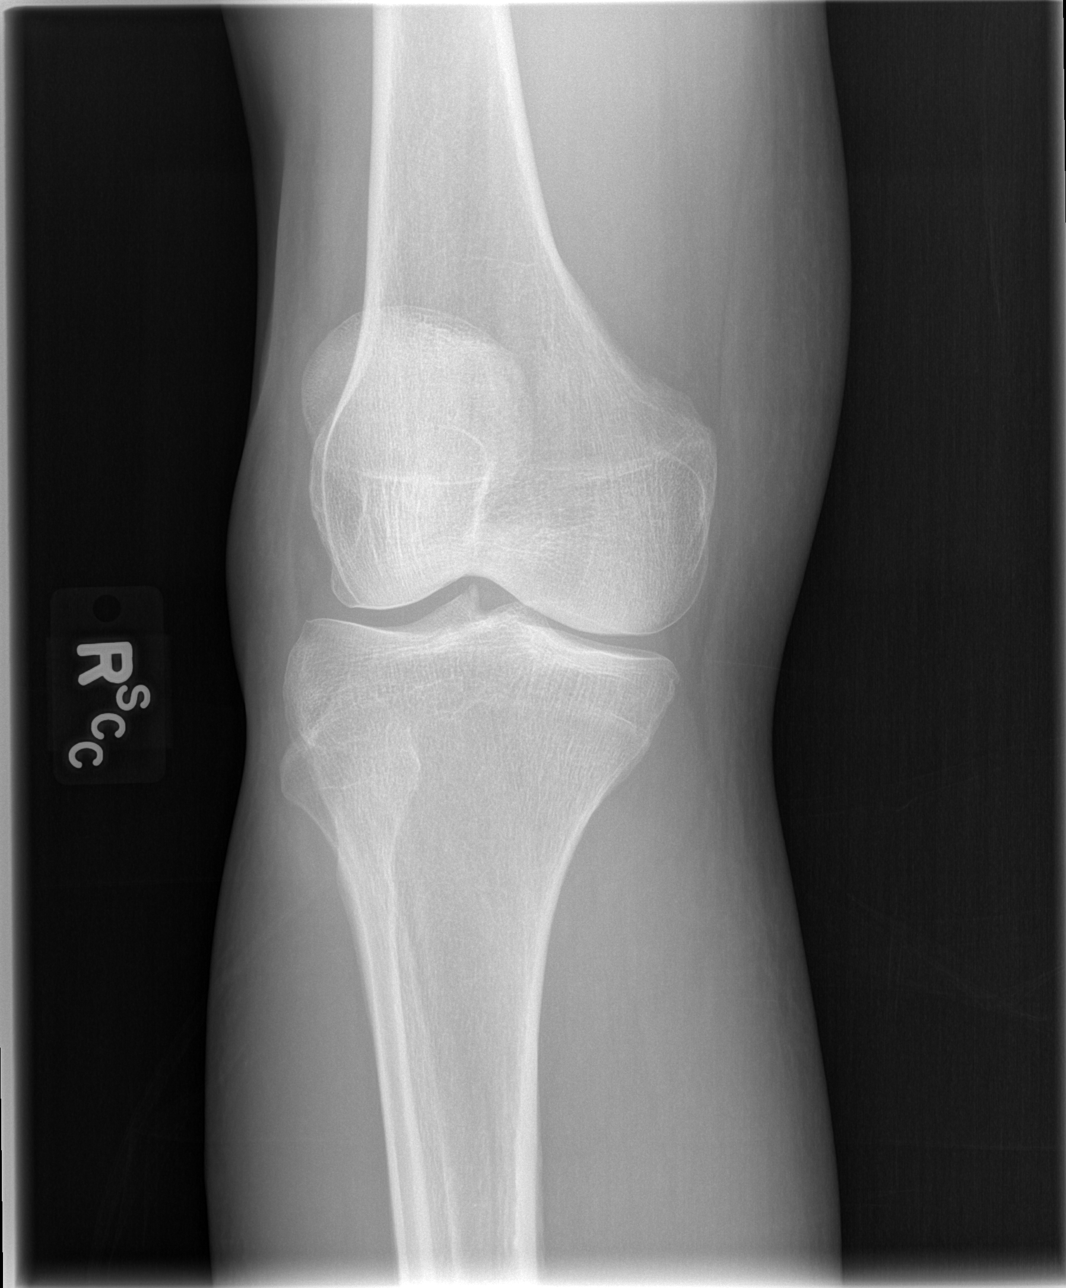

[t knee oblique right (2 of 2)]
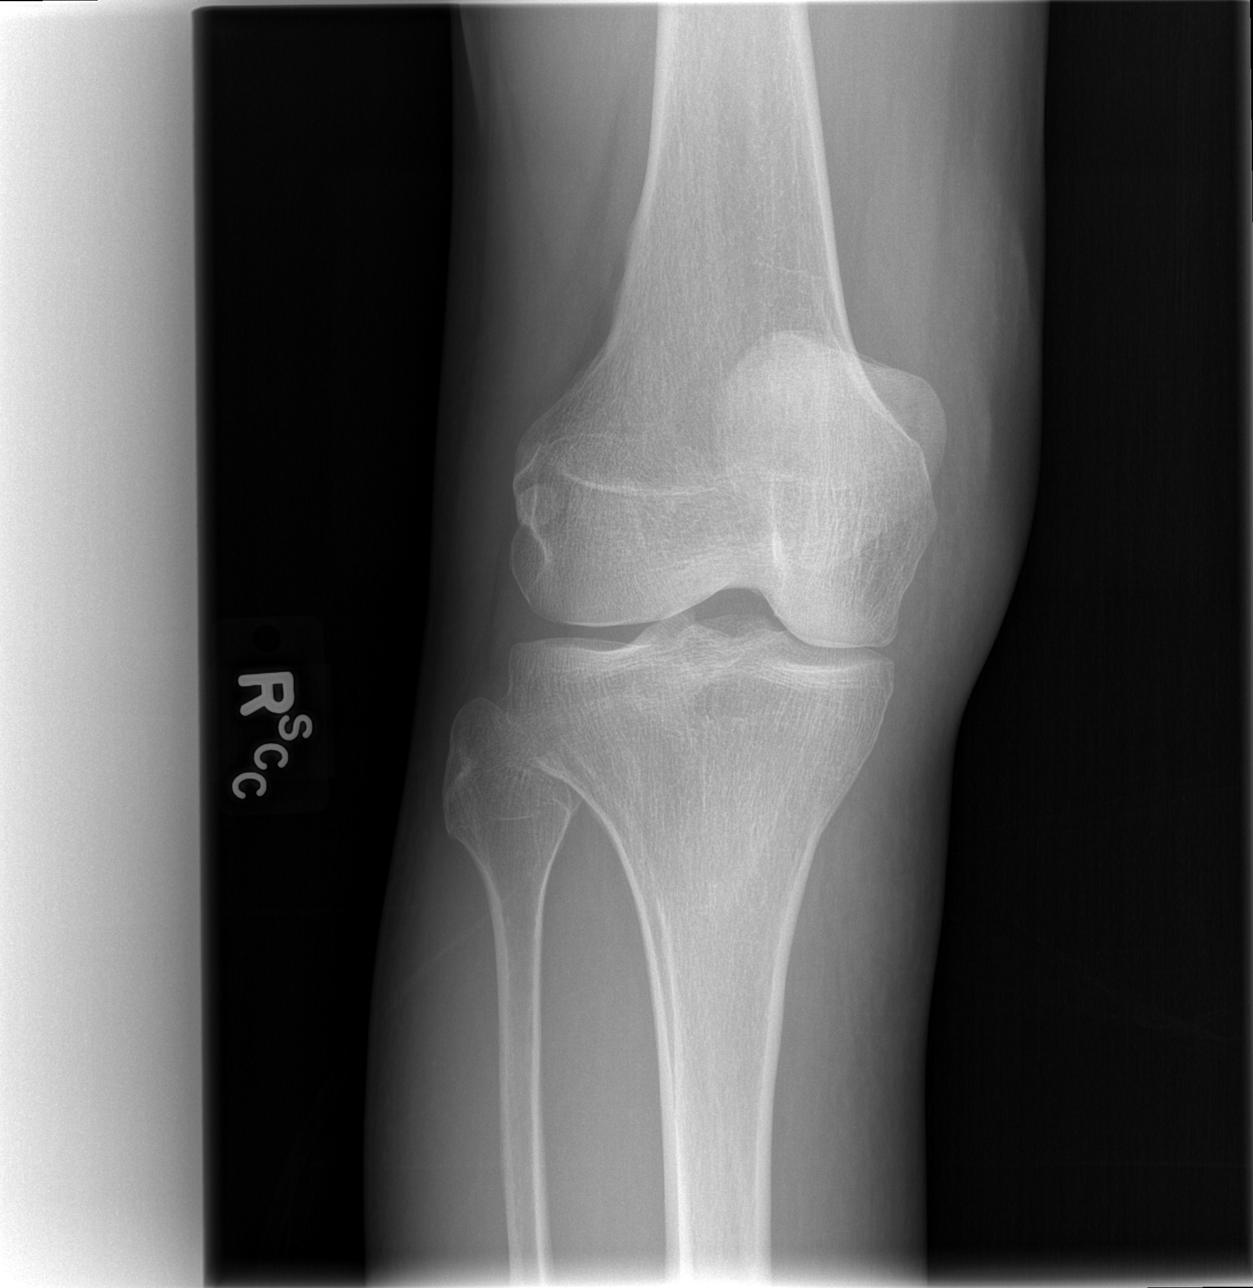

[t knee lat right]
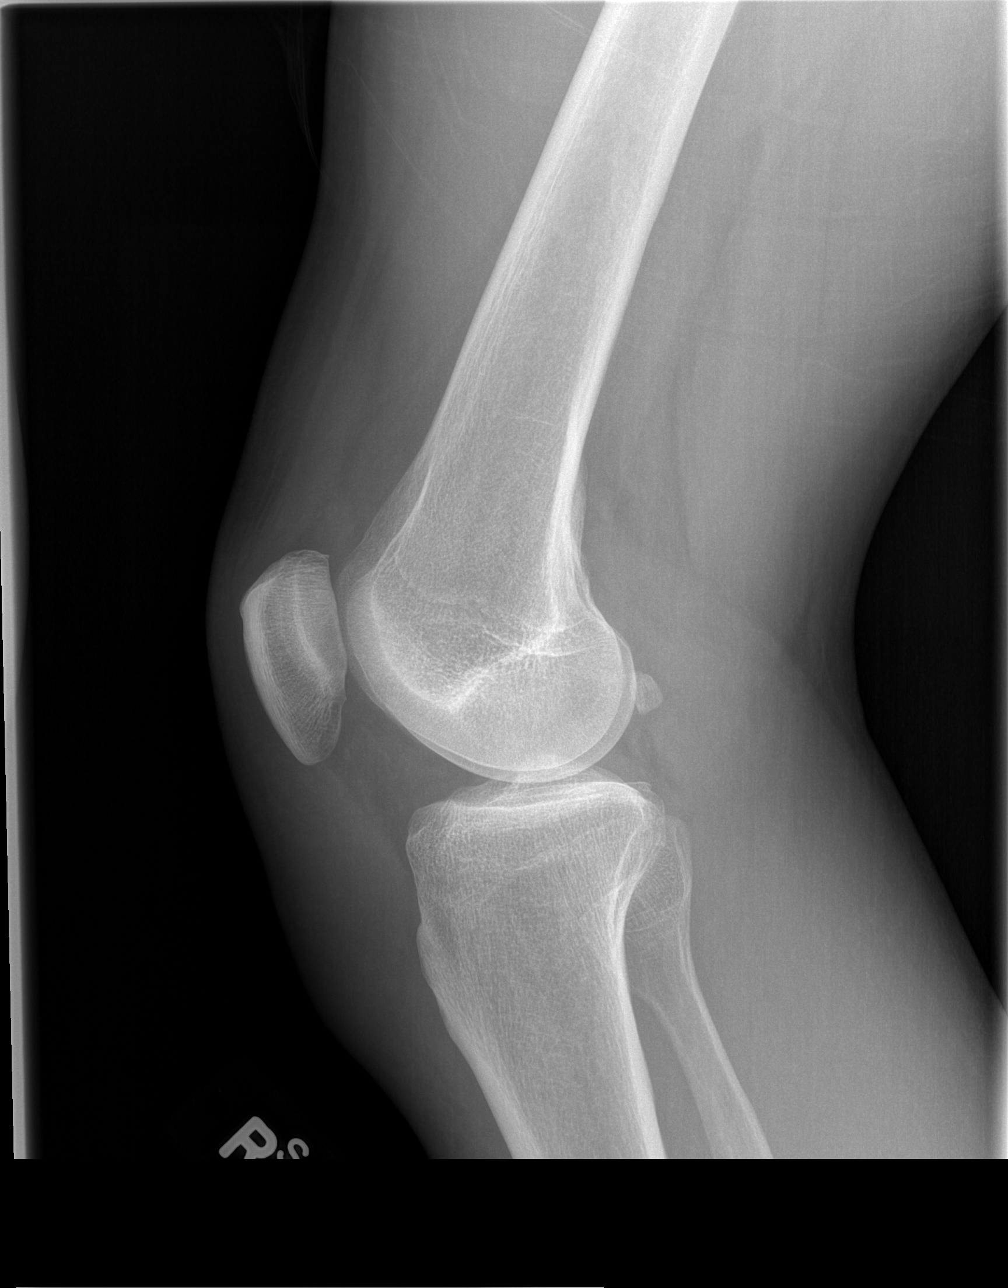

[4 of 4 positions shown; findings below may reference images not displayed]

FINDINGS: No evidence of acute or subacute fracture or dislocation.
Very mild medial compartment joint space narrowing.  Lateral and
patellofemoral compartment joint spaces well preserved.  No
intrinsic osseous abnormalities.  Small joint effusion.
IMPRESSION: No acute or subacute osseous abnormality.  Very mild medial
compartment joint space narrowing.  Small joint effusion.

## 2013-05-19 IMAGING — CR DG ANKLE COMPLETE 3+V*R*
3 series · 3 of 3 positions shown · non-contrast
Comparison: None.

CLINICAL DATA: Right ankle bruising and swelling medially.  No
known injury per patient.

RIGHT ANKLE - COMPLETE 3+ VIEW

[t ankle joint ap right]
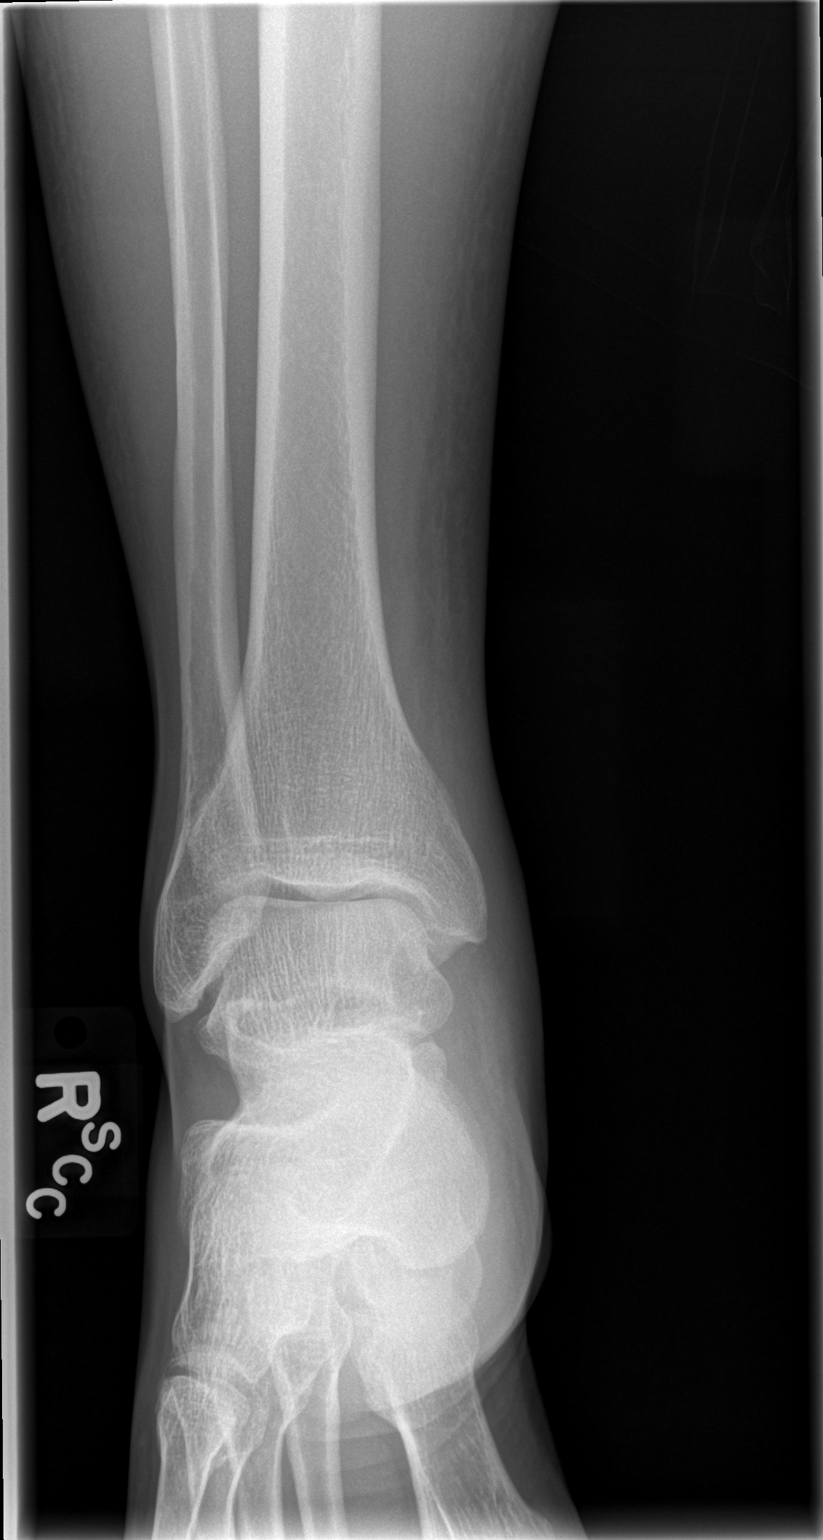

[t ankle joint oblique right]
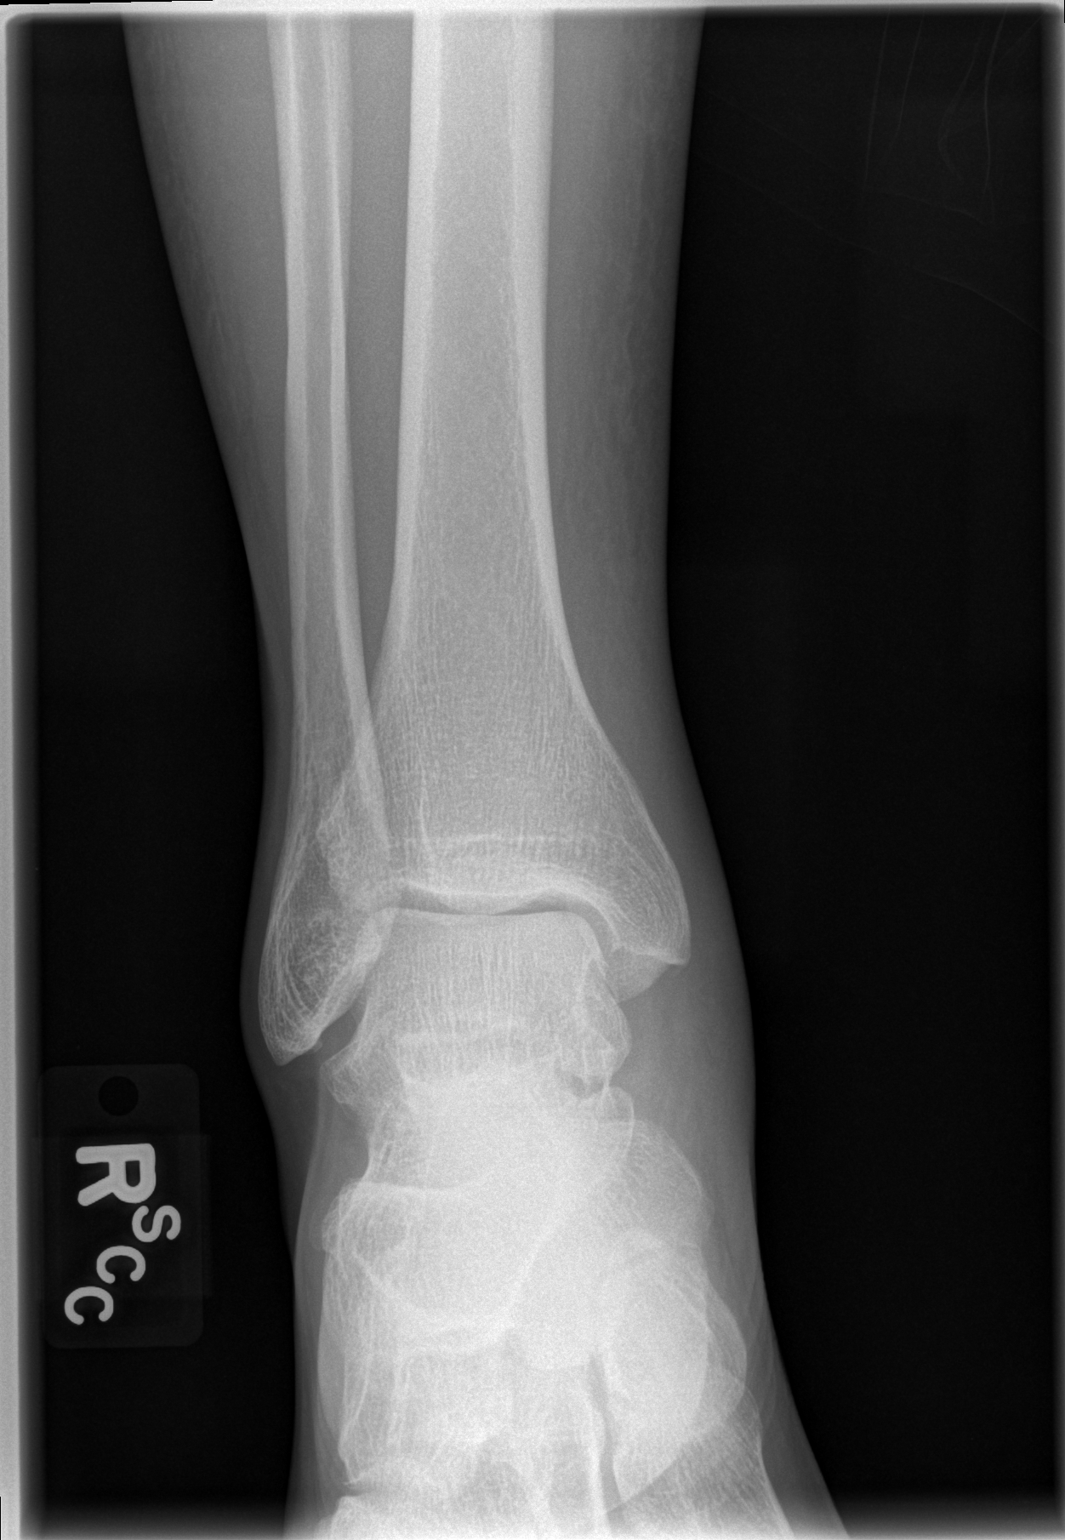

[t ankle joint lat right]
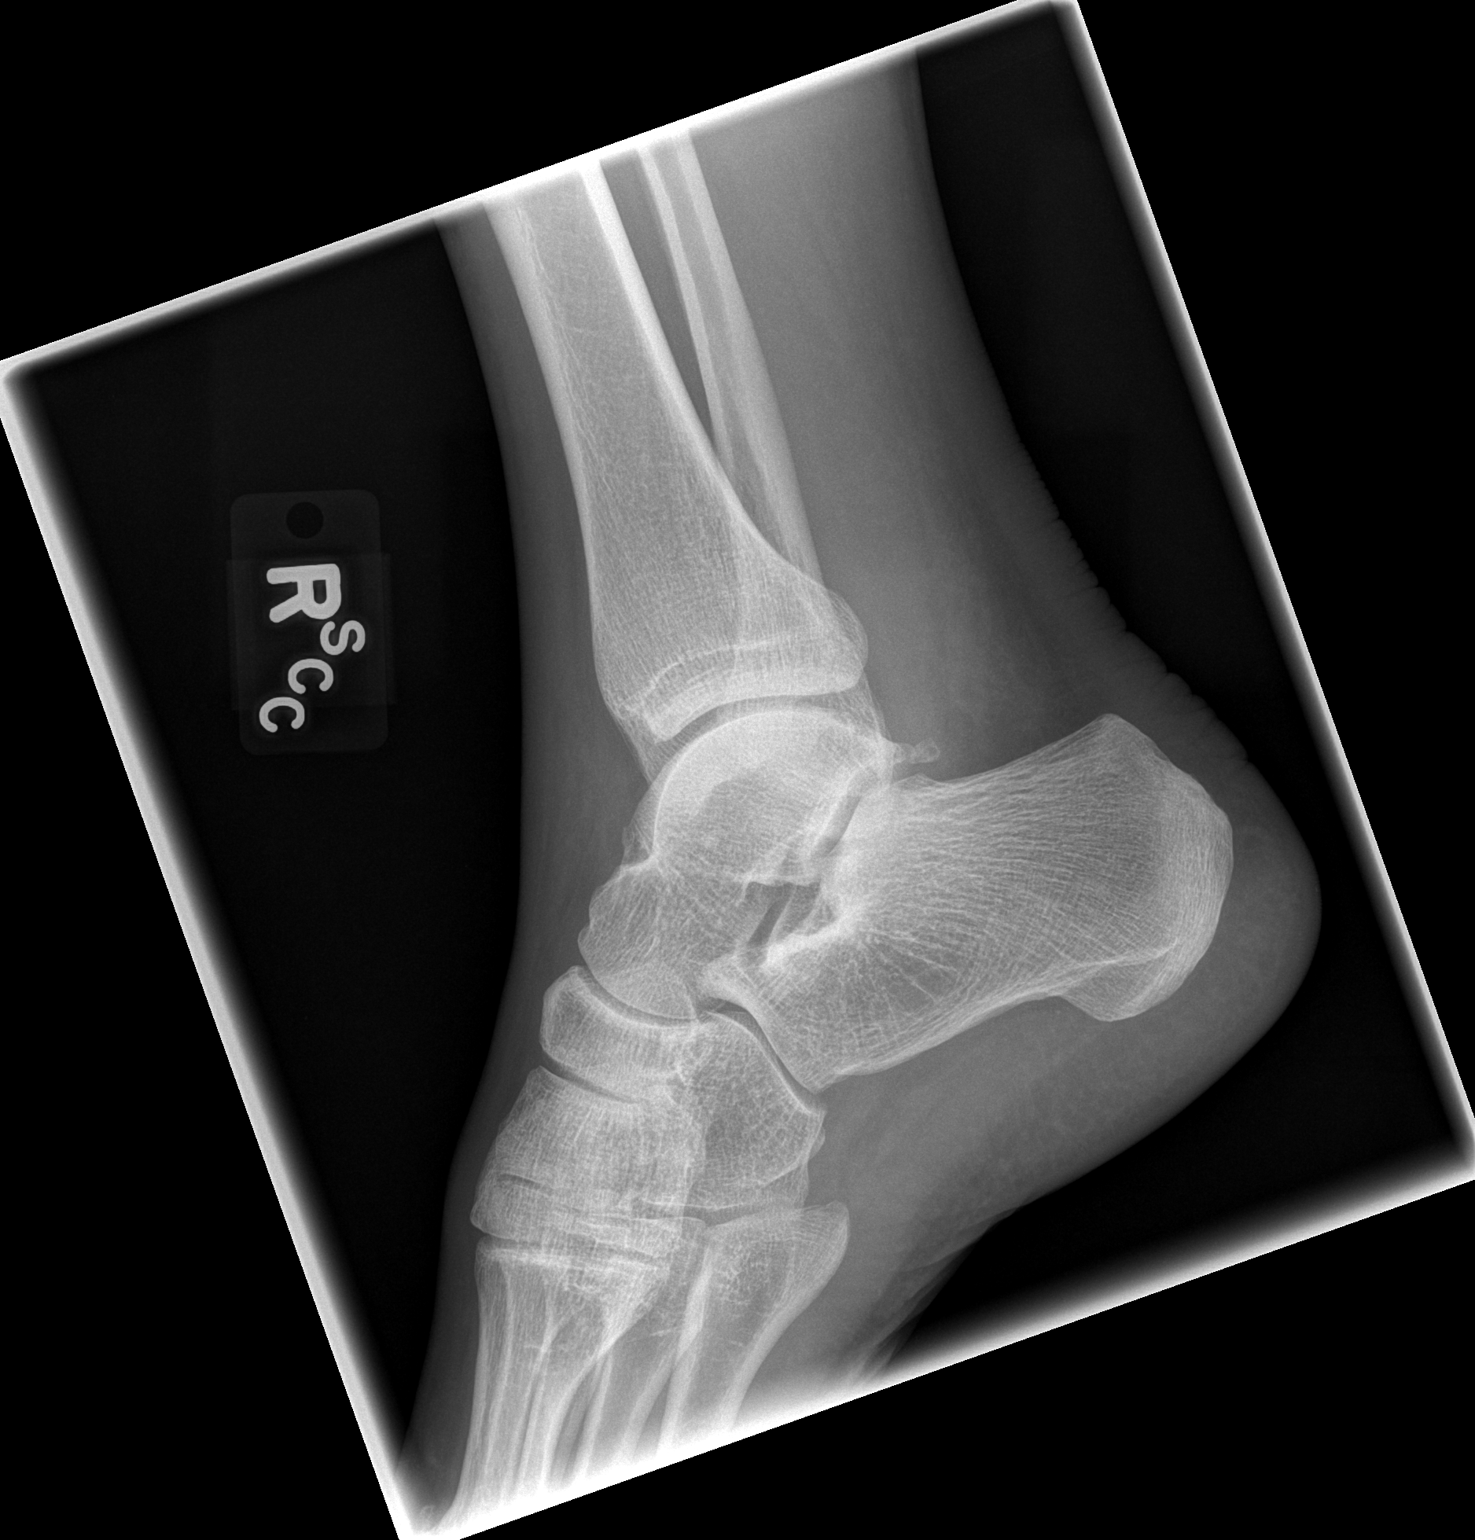

[3 of 3 positions shown; findings below may reference images not displayed]

FINDINGS: Medial soft tissue swelling.  No evidence of acute,
subacute, or healed fractures.  Ankle mortise intact with well-
preserved joint space.  No intrinsic osseous abnormalities.  No
evidence of a significant joint effusion.
IMPRESSION: No osseous abnormality.

## 2021-09-15 ENCOUNTER — Other Ambulatory Visit: Payer: Self-pay

## 2021-09-15 ENCOUNTER — Encounter: Payer: Self-pay | Admitting: Emergency Medicine

## 2021-09-15 DIAGNOSIS — Z820 Family history of epilepsy and other diseases of the nervous system: Secondary | ICD-10-CM

## 2021-09-15 DIAGNOSIS — A039 Shigellosis, unspecified: Secondary | ICD-10-CM | POA: Diagnosis present

## 2021-09-15 DIAGNOSIS — E860 Dehydration: Secondary | ICD-10-CM | POA: Diagnosis present

## 2021-09-15 DIAGNOSIS — E871 Hypo-osmolality and hyponatremia: Secondary | ICD-10-CM | POA: Diagnosis present

## 2021-09-15 DIAGNOSIS — E872 Acidosis, unspecified: Secondary | ICD-10-CM | POA: Diagnosis present

## 2021-09-15 DIAGNOSIS — Z79899 Other long term (current) drug therapy: Secondary | ICD-10-CM

## 2021-09-15 DIAGNOSIS — R7989 Other specified abnormal findings of blood chemistry: Secondary | ICD-10-CM | POA: Diagnosis present

## 2021-09-15 DIAGNOSIS — N179 Acute kidney failure, unspecified: Secondary | ICD-10-CM | POA: Diagnosis present

## 2021-09-15 DIAGNOSIS — Z833 Family history of diabetes mellitus: Secondary | ICD-10-CM

## 2021-09-15 DIAGNOSIS — A042 Enteroinvasive Escherichia coli infection: Principal | ICD-10-CM | POA: Diagnosis present

## 2021-09-15 DIAGNOSIS — Z20822 Contact with and (suspected) exposure to covid-19: Secondary | ICD-10-CM | POA: Diagnosis present

## 2021-09-15 DIAGNOSIS — E876 Hypokalemia: Secondary | ICD-10-CM | POA: Diagnosis not present

## 2021-09-15 DIAGNOSIS — F909 Attention-deficit hyperactivity disorder, unspecified type: Secondary | ICD-10-CM | POA: Diagnosis present

## 2021-09-15 DIAGNOSIS — F17210 Nicotine dependence, cigarettes, uncomplicated: Secondary | ICD-10-CM | POA: Diagnosis present

## 2021-09-15 LAB — COMPREHENSIVE METABOLIC PANEL
ALT: 19 U/L (ref 0–44)
AST: 27 U/L (ref 15–41)
Albumin: 3.3 g/dL — ABNORMAL LOW (ref 3.5–5.0)
Alkaline Phosphatase: 53 U/L (ref 38–126)
Anion gap: 19 — ABNORMAL HIGH (ref 5–15)
BUN: 49 mg/dL — ABNORMAL HIGH (ref 8–23)
CO2: 19 mmol/L — ABNORMAL LOW (ref 22–32)
Calcium: 8.4 mg/dL — ABNORMAL LOW (ref 8.9–10.3)
Chloride: 91 mmol/L — ABNORMAL LOW (ref 98–111)
Creatinine, Ser: 3.05 mg/dL — ABNORMAL HIGH (ref 0.61–1.24)
GFR, Estimated: 22 mL/min — ABNORMAL LOW (ref >60)
Glucose, Bld: 153 mg/dL — ABNORMAL HIGH (ref 70–99)
Potassium: 4.1 mmol/L (ref 3.5–5.1)
Sodium: 129 mmol/L — ABNORMAL LOW (ref 135–145)
Total Bilirubin: 0.9 mg/dL (ref 0.3–1.2)
Total Protein: 7 g/dL (ref 6.5–8.1)

## 2021-09-15 LAB — RESP PANEL BY RT-PCR (FLU A&B, COVID) ARPGX2
Influenza A by PCR: NEGATIVE
Influenza B by PCR: NEGATIVE
SARS Coronavirus 2 by RT PCR: NEGATIVE

## 2021-09-15 LAB — CBC
HCT: 52.2 % — ABNORMAL HIGH (ref 39.0–52.0)
Hemoglobin: 18 g/dL — ABNORMAL HIGH (ref 13.0–17.0)
MCH: 26.6 pg (ref 26.0–34.0)
MCHC: 34.5 g/dL (ref 30.0–36.0)
MCV: 77.1 fL — ABNORMAL LOW (ref 80.0–100.0)
Platelets: 331 10*3/uL (ref 150–400)
RBC: 6.77 MIL/uL — ABNORMAL HIGH (ref 4.22–5.81)
RDW: 13.9 % (ref 11.5–15.5)
WBC: 16 10*3/uL — ABNORMAL HIGH (ref 4.0–10.5)
nRBC: 0 % (ref 0.0–0.2)

## 2021-09-15 LAB — LIPASE, BLOOD: Lipase: 19 U/L (ref 11–51)

## 2021-09-15 MED ORDER — LACTATED RINGERS IV BOLUS: 1000.0000 mL | Freq: Once | INTRAVENOUS | Status: DC

## 2021-09-15 MED ORDER — LACTATED RINGERS IV BOLUS
2000.0000 mL | Freq: Once | INTRAVENOUS | Status: AC
  Administered 2021-09-15: 2000 mL via INTRAVENOUS

## 2021-09-15 NOTE — ED Triage Notes (Signed)
Vomiting and diarrhea x 1 day. Per family, patient weak, unable to stand up.  Patient is AAOx3.  Skin warm and dry. NAD

## 2021-09-16 ENCOUNTER — Encounter: Payer: Self-pay | Admitting: Internal Medicine

## 2021-09-16 ENCOUNTER — Emergency Department: Payer: Self-pay

## 2021-09-16 ENCOUNTER — Inpatient Hospital Stay
Admission: EM | Admit: 2021-09-16 | Discharge: 2021-09-21 | DRG: 372 | Disposition: A | Discharge status: Home / Self Care | Payer: Self-pay | Attending: Internal Medicine | Admitting: Internal Medicine

## 2021-09-16 DIAGNOSIS — A09 Infectious gastroenteritis and colitis, unspecified: Secondary | ICD-10-CM | POA: Diagnosis present

## 2021-09-16 DIAGNOSIS — N179 Acute kidney failure, unspecified: Secondary | ICD-10-CM | POA: Diagnosis present

## 2021-09-16 DIAGNOSIS — E8729 Other acidosis: Secondary | ICD-10-CM | POA: Diagnosis present

## 2021-09-16 DIAGNOSIS — E871 Hypo-osmolality and hyponatremia: Secondary | ICD-10-CM | POA: Diagnosis present

## 2021-09-16 DIAGNOSIS — R112 Nausea with vomiting, unspecified: Secondary | ICD-10-CM

## 2021-09-16 DIAGNOSIS — K529 Noninfective gastroenteritis and colitis, unspecified: Secondary | ICD-10-CM

## 2021-09-16 DIAGNOSIS — A042 Enteroinvasive Escherichia coli infection: Secondary | ICD-10-CM | POA: Diagnosis present

## 2021-09-16 LAB — GASTROINTESTINAL PANEL BY PCR, STOOL (REPLACES STOOL CULTURE)

## 2021-09-16 LAB — CBC
HCT: 47.6 % (ref 39.0–52.0)
Hemoglobin: 16.2 g/dL (ref 13.0–17.0)
MCH: 25.8 pg — ABNORMAL LOW (ref 26.0–34.0)
MCHC: 34 g/dL (ref 30.0–36.0)
MCV: 75.7 fL — ABNORMAL LOW (ref 80.0–100.0)
Platelets: 291 10*3/uL (ref 150–400)
RBC: 6.29 MIL/uL — ABNORMAL HIGH (ref 4.22–5.81)
RDW: 13.4 % (ref 11.5–15.5)
WBC: 8.1 10*3/uL (ref 4.0–10.5)
nRBC: 0 % (ref 0.0–0.2)

## 2021-09-16 LAB — BASIC METABOLIC PANEL
Anion gap: 7 (ref 5–15)
BUN: 45 mg/dL — ABNORMAL HIGH (ref 8–23)
CO2: 28 mmol/L (ref 22–32)
Calcium: 8.5 mg/dL — ABNORMAL LOW (ref 8.9–10.3)
Chloride: 98 mmol/L (ref 98–111)
Creatinine, Ser: 1.79 mg/dL — ABNORMAL HIGH (ref 0.61–1.24)
GFR, Estimated: 42 mL/min — ABNORMAL LOW (ref >60)
Glucose, Bld: 133 mg/dL — ABNORMAL HIGH (ref 70–99)
Potassium: 4.5 mmol/L (ref 3.5–5.1)
Sodium: 133 mmol/L — ABNORMAL LOW (ref 135–145)

## 2021-09-16 LAB — TROPONIN I (HIGH SENSITIVITY): Troponin I (High Sensitivity): 27 ng/L — ABNORMAL HIGH (ref <18)

## 2021-09-16 LAB — LACTIC ACID, PLASMA: Lactic Acid, Venous: 2.9 mmol/L (ref 0.5–1.9)

## 2021-09-16 LAB — C DIFFICILE QUICK SCREEN W PCR REFLEX
C Diff antigen: NEGATIVE
C Diff interpretation: NOT DETECTED
C Diff toxin: NEGATIVE

## 2021-09-16 LAB — HIV ANTIBODY (ROUTINE TESTING W REFLEX): HIV Screen 4th Generation wRfx: NONREACTIVE

## 2021-09-16 MED ORDER — AMPHETAMINE-DEXTROAMPHETAMINE 10 MG PO TABS
30.0000 mg | ORAL_TABLET | Freq: Two times a day (BID) | ORAL | Status: DC
  Administered 2021-09-16 – 2021-09-21 (×6): 30 mg via ORAL
  Filled 2021-09-16 (×5): qty 3

## 2021-09-16 MED ORDER — PIPERACILLIN-TAZOBACTAM 3.375 G IVPB
3.3750 g | Freq: Three times a day (TID) | INTRAVENOUS | Status: DC
  Administered 2021-09-16 – 2021-09-17 (×4): 3.375 g via INTRAVENOUS
  Filled 2021-09-16 (×4): qty 50

## 2021-09-16 MED ORDER — ACETAMINOPHEN 650 MG RE SUPP: 650.0000 mg | Freq: Four times a day (QID) | RECTAL | Status: DC | PRN

## 2021-09-16 MED ORDER — AMPHETAMINE-DEXTROAMPHETAMINE 10 MG PO TABS
60.0000 mg | ORAL_TABLET | Freq: Every day | ORAL | Status: DC
  Filled 2021-09-16: qty 6

## 2021-09-16 MED ORDER — ONDANSETRON HCL 4 MG PO TABS: 4.0000 mg | ORAL_TABLET | Freq: Four times a day (QID) | ORAL | Status: DC | PRN

## 2021-09-16 MED ORDER — ONDANSETRON HCL 4 MG/2ML IJ SOLN: 4.0000 mg | Freq: Four times a day (QID) | INTRAMUSCULAR | Status: DC | PRN

## 2021-09-16 MED ORDER — ONDANSETRON HCL 4 MG/2ML IJ SOLN
4.0000 mg | Freq: Once | INTRAMUSCULAR | Status: AC
  Administered 2021-09-16: 4 mg via INTRAVENOUS
  Filled 2021-09-16: qty 2

## 2021-09-16 MED ORDER — MORPHINE SULFATE (PF) 2 MG/ML IV SOLN
2.0000 mg | INTRAVENOUS | Status: DC | PRN
  Administered 2021-09-16: 08:00:00 2 mg via INTRAVENOUS
  Filled 2021-09-16: qty 1

## 2021-09-16 MED ORDER — LACTATED RINGERS IV SOLN: INTRAVENOUS | Status: DC

## 2021-09-16 MED ORDER — ONDANSETRON HCL 4 MG PO TABS
4.0000 mg | ORAL_TABLET | Freq: Four times a day (QID) | ORAL | Status: DC | PRN
Start: 2021-09-16 — End: 2021-09-16

## 2021-09-16 MED ORDER — PIPERACILLIN-TAZOBACTAM 3.375 G IVPB 30 MIN
3.3750 g | Freq: Once | INTRAVENOUS | Status: AC
  Administered 2021-09-16: 3.375 g via INTRAVENOUS
  Filled 2021-09-16: qty 50

## 2021-09-16 MED ORDER — ENOXAPARIN SODIUM 40 MG/0.4ML IJ SOSY
40.0000 mg | PREFILLED_SYRINGE | INTRAMUSCULAR | Status: DC
  Administered 2021-09-16 – 2021-09-21 (×6): 40 mg via SUBCUTANEOUS
  Filled 2021-09-16 (×6): qty 0.4

## 2021-09-16 MED ORDER — ENOXAPARIN SODIUM 30 MG/0.3ML IJ SOSY: 30.0000 mg | PREFILLED_SYRINGE | INTRAMUSCULAR | Status: DC

## 2021-09-16 MED ORDER — ACETAMINOPHEN 325 MG PO TABS: 650.0000 mg | ORAL_TABLET | Freq: Four times a day (QID) | ORAL | Status: DC | PRN

## 2021-09-16 MED ORDER — SODIUM CHLORIDE 0.9 % IV SOLN: INTRAVENOUS | Status: DC

## 2021-09-16 MED ORDER — HYDROCODONE-ACETAMINOPHEN 5-325 MG PO TABS
1.0000 | ORAL_TABLET | ORAL | Status: DC | PRN
  Administered 2021-09-18 – 2021-09-20 (×2): 1 via ORAL
  Filled 2021-09-16 (×2): qty 1

## 2021-09-16 NOTE — Progress Notes (Signed)
Pharmacy Antibiotic Note  Victor Berger is a 65 y.o. male admitted on 09/16/2021 with  intra-abdominal .  Pharmacy has been consulted for Zosyn dosing.  Plan: Zosyn 3.375 gm IV X 1 given over 30 min on 10/29 @ 0142. Zosyn 3.375 gm IV Q8H EI ordered to continue on 10/29 @ 0800.   Height: 5\' 9"  (175.3 cm) Weight: 70.3 kg (154 lb 15.7 oz) IBW/kg (Calculated) : 70.7  Temp (24hrs), Avg:98.1 F (36.7 C), Min:97.6 F (36.4 C), Max:98.9 F (37.2 C)  Recent Labs  Lab 09/15/21 1631  WBC 16.0*  CREATININE 3.05*    Estimated Creatinine Clearance: 24 mL/min (A) (by C-G formula based on SCr of 3.05 mg/dL (H)).    No Known Allergies  Antimicrobials this admission:   >>    >>   Dose adjustments this admission:   Microbiology results:  BCx:   UCx:    Sputum:    MRSA PCR:   Thank you for allowing pharmacy to be a part of this patient's care.  Algie Cales D 09/16/2021 3:08 AM

## 2021-09-16 NOTE — ED Provider Notes (Signed)
[facility] Emergency Department Provider Note  ____________________________________________   Event Date/Time   First MD Initiated Contact with Patient 09/16/21 0015     (approximate)  I have reviewed the triage vital signs and the nursing notes.   HISTORY  Chief Complaint Vomiting and Diarrhea    HPI JOHNHENRY TIPPIN is a 65 y.o. male with no significant past medical history who presents to the emergency department with his son with concerns for nausea, vomiting and diarrhea for 2 days.  States he just went on a trip to [place] and multiple other people on the trip developed similar symptoms.  No international travel.  No recent hospitalization or antibiotic use.  He states that the vomiting has improved but now he has having multiple episodes of diarrhea that are streaked with blood.  States he has not had any melena but noticed his stool was dark after taking Pepto-Bismol.  Denies any abdominal pain.  States he has had subjective fevers and chills.  No aggravating or alleviating factors.  Patient and son at bedside states that patient has not been to the doctor in many years.  Denies chest pain, shortness of breath, cough.        History reviewed. No pertinent past medical history.  There are no problems to display for this patient.   Past Surgical History:  Procedure Laterality Date   DENTAL SURGERY      Prior to Admission medications   Medication Sig Start Date End Date Taking? Authorizing Provider  predniSONE (DELTASONE) 10 MG tablet Take 5 tablets (50 mg total) by mouth daily. 05/20/12   Marlon Pel, PA-C    Allergies Patient has no known allergies.  Family History  Problem Relation Age of Onset   Parkinsonism Mother    Diabetes Father    Cholelithiasis Other     Social History Social History   Tobacco Use   Smoking status: Every Day    Packs/day: 0.50    Years: 20.00    Pack years: 10.00    Types: Cigarettes   Substance Use Topics   Alcohol use: Yes   Drug use: No    Review of Systems Constitutional: + Subjective fever. Eyes: No visual changes. ENT: No sore throat. Cardiovascular: Denies chest pain. Respiratory: Denies shortness of breath. Gastrointestinal: + nausea, vomiting, diarrhea. Genitourinary: Negative for dysuria. Musculoskeletal: Negative for back pain. Skin: Negative for rash. Neurological: Negative for focal weakness or numbness.  ____________________________________________   PHYSICAL EXAM:  VITAL SIGNS: ED Triage Vitals  Enc Vitals Group     BP 09/15/21 1625 131/88     Pulse Rate 09/15/21 1625 87     Resp 09/15/21 1625 16     Temp 09/15/21 1625 98.9 F (37.2 C)     Temp Source 09/15/21 1625 Oral     SpO2 09/15/21 1625 100 %     Weight 09/15/21 1536 154 lb 15.7 oz (70.3 kg)     Height 09/15/21 1536 5\' 9"  (1.753 m)     Head Circumference --      Peak Flow --      Pain Score 09/15/21 1536 0     Pain Loc --      Pain Edu? --      Excl. in GC? --    CONSTITUTIONAL: Alert and oriented and responds appropriately to questions. Well-appearing; well-nourished HEAD: Normocephalic EYES: Conjunctivae clear, pupils appear equal, EOM appear intact ENT: normal nose; dry mucous membranes NECK: Supple, normal ROM CARD:  Regular and tachycardic; S1 and S2 appreciated; no murmurs, no clicks, no rubs, no gallops RESP: Normal chest excursion without splinting or tachypnea; breath sounds clear and equal bilaterally; no wheezes, no rhonchi, no rales, no hypoxia or respiratory distress, speaking full sentences ABD/GI: Normal bowel sounds; non-distended; soft, non-tender, no rebound, no guarding, no peritoneal signs, no hepatosplenomegaly BACK: The back appears normal EXT: Normal ROM in all joints; no deformity noted, no edema; no cyanosis SKIN: Normal color for age and race; warm; no rash on exposed skin NEURO: Moves all extremities equally PSYCH: The patient's mood and manner  are appropriate.  ____________________________________________   LABS (all labs ordered are listed, but only abnormal results are displayed)  Labs Reviewed  COMPREHENSIVE METABOLIC PANEL - Abnormal; Notable for the following components:      Result Value   Sodium 129 (*)    Chloride 91 (*)    CO2 19 (*)    Glucose, Bld 153 (*)    BUN 49 (*)    Creatinine, Ser 3.05 (*)    Calcium 8.4 (*)    Albumin 3.3 (*)    GFR, Estimated 22 (*)    Anion gap 19 (*)    All other components within normal limits  CBC - Abnormal; Notable for the following components:   WBC 16.0 (*)    RBC 6.77 (*)    Hemoglobin 18.0 (*)    HCT 52.2 (*)    MCV 77.1 (*)    All other components within normal limits  TROPONIN I (HIGH SENSITIVITY) - Abnormal; Notable for the following components:   Troponin I (High Sensitivity) 27 (*)    All other components within normal limits  RESP PANEL BY RT-PCR (FLU A&B, COVID) ARPGX2  GASTROINTESTINAL PANEL BY PCR, STOOL (REPLACES STOOL CULTURE)  C DIFFICILE QUICK SCREEN W PCR REFLEX    LIPASE, BLOOD  URINALYSIS, ROUTINE W REFLEX MICROSCOPIC  LACTIC ACID, PLASMA   ____________________________________________  EKG   EKG Interpretation  Date/Time:  Saturday September 16 2021 01:14:43 EDT Ventricular Rate:  115 PR Interval:  138 QRS Duration: 133 QT Interval:  350 QTC Calculation: 485 R Axis:   204 Text Interpretation: Sinus tachycardia Right bundle branch block ST elevation, consider inferior injury Baseline wander in lead(s) V1 V2 No old tracing to compare Confirmed by Rochele Raring [phone]) on 09/16/2021 1:27:32 AM        ____________________________________________  RADIOLOGY Normajean Baxter Nylah Butkus, personally viewed and evaluated these images (plain radiographs) as part of my medical decision making, as well as reviewing the written report by the radiologist.  ED MD interpretation: CT shows diffuse colitis of the ascending and rectosigmoid colon.  Official  radiology report(s): CT ABDOMEN PELVIS WO CONTRAST  Result Date: 09/16/2021 CLINICAL DATA:  Vomiting, diarrhea EXAM: CT ABDOMEN AND PELVIS WITHOUT CONTRAST TECHNIQUE: Multidetector CT imaging of the abdomen and pelvis was performed following the standard protocol without IV contrast. COMPARISON:  11/12/2011. FINDINGS: Lower chest: Lung bases are clear. Hepatobiliary: Unenhanced liver is unremarkable. Mild layering gallbladder sludge (series 2/image 33). No intrahepatic or extrahepatic ductal dilatation. Pancreas: Within normal limits. Spleen: Within normal limits. Adrenals/Urinary Tract: Adrenal glands are within normal limits. Kidneys are within normal limits. No renal calculi or hydronephrosis. Mildly thick-walled bladder, although underdistended. Stomach/Bowel: Stomach is notable for a tiny hiatal hernia. No evidence of bowel obstruction. Normal appendix (series 2/image 65). Long segment wall thickening involving the ascending colon, mildly masslike (series 2/image 63), but favoring colitis given the long segment involvement. Mild  pericolonic inflammation without pneumatosis, drainable fluid collection/abscess, or free air. Additional long segment wall thickening with a haustra appearance of the rectosigmoid colon, most prominent in the lower rectum (series 2/image 82), also suggesting infectious/inflammatory colitis. Vascular/Lymphatic: No evidence of abdominal aortic aneurysm. No suspicious abdominopelvic lymphadenopathy. Reproductive: Prostate is unremarkable. Other: No abdominopelvic ascites. Small fat containing left inguinal hernia. Moderate fat containing right inguinal hernia containing trace fluid. Musculoskeletal: Mild degenerative changes of the visualized thoracolumbar spine. IMPRESSION: Suspected infectious/inflammatory colitis involving the ascending colon and rectosigmoid colon. No pneumatosis, drainable fluid collection/abscess, or free air. Although this appearance is not considered highly  suspicious for underlying mass, follow-up colonoscopy is suggested. Electronically Signed   By: Charline Bills M.D.   On: 09/16/2021 01:11    ____________________________________________   PROCEDURES  Procedure(s) performed (including Critical Care):  Procedures  ____________________________________________   INITIAL IMPRESSION / ASSESSMENT AND PLAN / ED COURSE  As part of my medical decision making, I reviewed the following data within the electronic MEDICAL RECORD NUMBER History obtained from family, Nursing notes reviewed and incorporated, Labs reviewed , EKG interpreted , Old EKG reviewed, Discussed with admitting physician , CT reviewed, and Notes from prior ED visits         Patient here with complaints of nausea, vomiting and diarrhea.  Differential diagnosis includes viral gastroenteritis, colitis, diverticulitis, infectious diarrhea, inflammatory bowel disease, bowel obstruction.  Labs obtained in triage reviewed show elevated creatinine and BUN.  Last for comparison it was in 2013 and was normal.  States he is not aware of any history of chronic kidney disease.  I suspect that some of this is likely due to dehydration.  Will hydrate aggressively.  Will give antiemetics.  Will obtain stool studies and CT of abdomen pelvis.  Patient will need admission.  ED PROGRESS  1:28 AM  CT imaging reviewed by myself and radiologist shows colitis which I suspect is infectious in nature.  We will add on a lactic but low suspicion that this is ischemic.  Will give IV Zosyn and continue IV hydration.  Stool studies have been ordered but he has not yet provided [place] with a sample.  Will discuss with hospitalist for admission.   2:07 AM Discussed patient's case with hospitalist, Dr. Para March.  I have recommended admission and patient (and family if present) agree with this plan. Admitting physician will place admission orders.   I reviewed all nursing notes, vitals, pertinent previous records and  reviewed/interpreted all EKGs, lab and urine results, imaging (as available).  ____________________________________________   FINAL CLINICAL IMPRESSION(S) / ED DIAGNOSES  Final diagnoses:  Nausea vomiting and diarrhea  AKI (acute kidney injury) (HCC)  Colitis     ED Discharge Orders     None       *Please note:  ERION GAUNCE was evaluated in Emergency Department on 09/16/2021 for the symptoms described in the history of present illness. He was evaluated in the context of the global COVID-19 pandemic, which necessitated consideration that the patient might be at risk for infection with the SARS-CoV-2 virus that causes COVID-19. Institutional protocols and algorithms that pertain to the evaluation of patients at risk for COVID-19 are in a state of rapid change based on information released by regulatory bodies including the CDC and federal and state organizations. These policies and algorithms were followed during the patient's care in the ED.  Some ED evaluations and interventions may be delayed as a result of limited staffing during and the pandemic.*  Note:  This document was prepared using Dragon voice recognition software and may include unintentional dictation errors.    Katlin Ciszewski, Layla Maw, DO 09/16/21 [phone]

## 2021-09-16 NOTE — H&P (Signed)
History and Physical    DAGON BUDAI DEY:[id number] DOB: 11-28-1955 DOA: 09/16/2021  PCP: [facility]   Patient coming from: home  I have personally briefly reviewed patient's old medical records in [facility]  Chief Complaint: Vomiting with blood-streaked diarrhea  HPI: Victor Berger is a 65 y.o. male with medical history significant for Right lumbar radiculopathy who presents to the ED with vomiting and diarrhea x2 days following a trip to [place].  Several people on the trip also developed similar symptoms.  Informant: Has since improved but episodes of diarrhea have become more numerous and are now streaked with blood.  He denies abdominal pain or fevers or chills.  He denies cough shortness of breath or chest pain.  On arrival vitals within normal limits though breath intermittent softening of blood pressure to 119/83 intermittent tachycardia to 108 and tachypnea to 22.  O2 sats remained in the high 90s on room air Blood work significant for WBC 16,000.  Troponin 27 Creatinine 3.09, baseline unknown.  Sodium 129, bicarb 19, anion gap 19, lipase 19 LFTs WNL COVID and flu negative  EKG, personally viewed and interpreted sinus tachycardia at 115 with nonspecific ST-T wave changes  Imaging: CT abdomen and pelvis suspected infectious/inflammatory colitis involving the ascending colon and rectosigmoid colon. Follow-up colonoscopy is suggested  Patient started on IV Zosyn and IV fluid bolus.  Hospitalist consulted for admission.    Review of Systems: As per HPI otherwise all other systems on review of systems negative.    History reviewed. No pertinent past medical history.  Past Surgical History:  Procedure Laterality Date   DENTAL SURGERY       reports that he has been smoking cigarettes. He has a 10.00 pack-year smoking history. He does not have any smokeless tobacco history on file. He reports current alcohol use. He reports that he does not use  drugs.  No Known Allergies  Family History  Problem Relation Age of Onset   Parkinsonism Mother    Diabetes Father    Cholelithiasis Other       Prior to Admission medications   Medication Sig Start Date End Date Taking? Authorizing Provider  amphetamine-dextroamphetamine (ADDERALL) 30 MG tablet Take 60 mg by mouth daily.   Yes [provider]    Physical Exam: Vitals:   09/16/21 0130 09/16/21 0145 09/16/21 0200 09/16/21 0215  BP: 125/90  117/80   Pulse: (!) 108 (!) 120 (!) 116   Resp: 18   20  Temp:      TempSrc:      SpO2:  97% 96%   Weight:      Height:         Vitals:   09/16/21 0130 09/16/21 0145 09/16/21 0200 09/16/21 0215  BP: 125/90  117/80   Pulse: (!) 108 (!) 120 (!) 116   Resp: 18   20  Temp:      TempSrc:      SpO2:  97% 96%   Weight:      Height:          Constitutional: Acutely ill-appearing but alert oriented x 3 . Not in any apparent distress HEENT:      Head: Normocephalic and atraumatic.         Eyes: PERLA, EOMI, Conjunctivae are normal. Sclera is non-icteric.       Mouth/Throat: Mucous membranes are moist.       Neck: Supple with no signs of meningismus. Cardiovascular: Regular rate and  rhythm. No murmurs, gallops, or rubs. 2+ symmetrical distal pulses are present . No JVD. No LE edema Respiratory: Respiratory effort normal .Lungs sounds clear bilaterally. No wheezes, crackles, or rhonchi.  Gastrointestinal: Soft, non tender, and non distended with positive bowel sounds.  Genitourinary: No CVA tenderness. Musculoskeletal: Nontender with normal range of motion in all extremities. No cyanosis, or erythema of extremities. Neurologic:  Face is symmetric. Moving all extremities. No gross focal neurologic deficits . Skin: Skin is warm, dry.  No rash or ulcers Psychiatric: Mood and affect are normal    Labs on Admission: I have personally reviewed following labs and imaging studies  CBC: Recent Labs  Lab 09/15/21 1631  WBC 16.0*   HGB 18.0*  HCT 52.2*  MCV 77.1*  PLT 331   Basic Metabolic Panel: Recent Labs  Lab 09/15/21 1631  NA 129*  K 4.1  CL 91*  CO2 19*  GLUCOSE 153*  BUN 49*  CREATININE 3.05*  CALCIUM 8.4*   GFR: Estimated Creatinine Clearance: 24 mL/min (A) (by C-G formula based on SCr of 3.05 mg/dL (H)). Liver Function Tests: Recent Labs  Lab 09/15/21 1631  AST 27  ALT 19  ALKPHOS 53  BILITOT 0.9  PROT 7.0  ALBUMIN 3.3*   Recent Labs  Lab 09/15/21 1631  LIPASE 19   No results for input(s): AMMONIA in the last 168 hours. Coagulation Profile: No results for input(s): INR, PROTIME in the last 168 hours. Cardiac Enzymes: No results for input(s): CKTOTAL, CKMB, CKMBINDEX, TROPONINI in the last 168 hours. BNP (last 3 results) No results for input(s): PROBNP in the last 8760 hours. HbA1C: No results for input(s): HGBA1C in the last 72 hours. CBG: No results for input(s): GLUCAP in the last 168 hours. Lipid Profile: No results for input(s): CHOL, HDL, LDLCALC, TRIG, CHOLHDL, LDLDIRECT in the last 72 hours. Thyroid Function Tests: No results for input(s): TSH, T4TOTAL, FREET4, T3FREE, THYROIDAB in the last 72 hours. Anemia Panel: No results for input(s): VITAMINB12, FOLATE, FERRITIN, TIBC, IRON, RETICCTPCT in the last 72 hours. Urine analysis:    Component Value Date/Time   COLORURINE YELLOW 11/12/2011 0444   APPEARANCEUR TURBID (A) 11/12/2011 0444   LABSPEC 1.029 11/12/2011 0444   PHURINE 5.5 11/12/2011 0444   GLUCOSEU NEGATIVE 11/12/2011 0444   HGBUR MODERATE (A) 11/12/2011 0444   BILIRUBINUR NEGATIVE 11/12/2011 0444   KETONESUR NEGATIVE 11/12/2011 0444   PROTEINUR NEGATIVE 11/12/2011 0444   UROBILINOGEN 1.0 11/12/2011 0444   NITRITE NEGATIVE 11/12/2011 0444   LEUKOCYTESUR NEGATIVE 11/12/2011 0444    Radiological Exams on Admission: CT ABDOMEN PELVIS WO CONTRAST  Result Date: 09/16/2021 CLINICAL DATA:  Vomiting, diarrhea EXAM: CT ABDOMEN AND PELVIS WITHOUT CONTRAST  TECHNIQUE: Multidetector CT imaging of the abdomen and pelvis was performed following the standard protocol without IV contrast. COMPARISON:  11/12/2011. FINDINGS: Lower chest: Lung bases are clear. Hepatobiliary: Unenhanced liver is unremarkable. Mild layering gallbladder sludge (series 2/image 33). No intrahepatic or extrahepatic ductal dilatation. Pancreas: Within normal limits. Spleen: Within normal limits. Adrenals/Urinary Tract: Adrenal glands are within normal limits. Kidneys are within normal limits. No renal calculi or hydronephrosis. Mildly thick-walled bladder, although underdistended. Stomach/Bowel: Stomach is notable for a tiny hiatal hernia. No evidence of bowel obstruction. Normal appendix (series 2/image 65). Long segment wall thickening involving the ascending colon, mildly masslike (series 2/image 63), but favoring colitis given the long segment involvement. Mild pericolonic inflammation without pneumatosis, drainable fluid collection/abscess, or free air. Additional long segment wall thickening with a haustra appearance  of the rectosigmoid colon, most prominent in the lower rectum (series 2/image 82), also suggesting infectious/inflammatory colitis. Vascular/Lymphatic: No evidence of abdominal aortic aneurysm. No suspicious abdominopelvic lymphadenopathy. Reproductive: Prostate is unremarkable. Other: No abdominopelvic ascites. Small fat containing left inguinal hernia. Moderate fat containing right inguinal hernia containing trace fluid. Musculoskeletal: Mild degenerative changes of the visualized thoracolumbar spine. IMPRESSION: Suspected infectious/inflammatory colitis involving the ascending colon and rectosigmoid colon. No pneumatosis, drainable fluid collection/abscess, or free air. Although this appearance is not considered highly suspicious for underlying mass, follow-up colonoscopy is suggested. Electronically Signed   By: Charline Bills M.D.   On: 09/16/2021 01:11      Assessment/Plan    Infectious colitis -Patient presents with vomiting and blood-streaked diarrhea with several known contacts following a trip together to [place] - CT abdomen and pelvis with infectious/inflammatory colitis involving ascending colon and rectosigmoid colon - Clear liquid diet - IV hydration, IV antiemetics    AKI (acute kidney injury) (HCC)   High anion gap metabolic acidosis - Creatinine of 3, baseline unknown with anion gap 19 bicarb 19 - Likely prerenal related to dehydration from acute infectious process - IV hydration, monitor renal function and avoid nephrotoxins  Hyponatremia - Hypovolemic secondary to above - IV hydration with normal saline and monitor    DVT prophylaxis: Lovenox  Code Status: full code  Family Communication:  none  Disposition Plan: Back to previous home environment Consults called: none  Status:At the time of admission, it appears that the appropriate admission status for this patient is INPATIENT. This is judged to be reasonable and necessary in order to provide the required intensity of service to ensure the patient's safety given the presenting symptoms, physical exam findings, and initial radiographic and laboratory data in the context of their  Comorbid conditions.   Patient requires inpatient status due to high intensity of service, high risk for further deterioration and high frequency of surveillance required.   I certify that at the point of admission it is my clinical judgment that the patient will require inpatient hospital care spanning beyond 2 midnights     Andris Baumann MD [facility]     09/16/2021, 2:33 AM

## 2021-09-16 NOTE — Progress Notes (Signed)
Patients HR and RR were elevated at evening VS checks. I held is dose of adderrall and also had to call a rapid response per RED mews protocol. During the rapid, the team did not see any reason to escalate further as his HR and RR seemed to drop to normal ranges after he calmed down a bit.Provider aware. Patient previously yellow mews this shift and we will continue to monitor VS q4h.

## 2021-09-16 NOTE — ED Notes (Addendum)
Request made for transport to the floor ?

## 2021-09-16 NOTE — ED Notes (Signed)
Pt incontinent of stool per family member.  Family member states stool is full of blood.  1st nurse notified

## 2021-09-16 NOTE — Progress Notes (Signed)
Anticoagulation monitoring(Lovenox):  65 yo male ordered Lovenox 30 mg Q24h    Filed Weights   09/15/21 1536 09/16/21 0305  Weight: 70.3 kg (154 lb 15.7 oz) 73.6 kg (162 lb 4.1 oz)   BMI    Lab Results  Component Value Date   CREATININE 1.79 (H) 09/16/2021   CREATININE 3.05 (H) 09/15/2021   CREATININE 0.84 05/19/2012   Estimated Creatinine Clearance: 41.1 mL/min (A) (by C-G formula based on SCr of 1.79 mg/dL (H)). Hemoglobin & Hematocrit     Component Value Date/Time   HGB 16.2 09/16/2021 0459   HGB 15.7 01/29/2012 1841   HCT 47.6 09/16/2021 0459   HCT 47.7 01/29/2012 1841     Per Protocol for Patient with estCrcl > 30 ml/min and BMI > 30, will transition to Lovenox 40 mg Q24h.

## 2021-09-16 NOTE — Progress Notes (Signed)
PROGRESS NOTE    Victor Berger  NWG:[id number] DOB: November 01, 1956 DOA: 09/16/2021 PCP: Clinic, [facility]    Brief Narrative:  Victor Berger is a 65 year old male with past medical history significant for ADHD who presented to [facility] ED on 8/28 with persistent nausea/vomiting, diarrhea x2 days.  Recently returned from trip to [place] with several individuals developing similar symptoms.  Patient also reports diarrhea progressively worsening now with streaks of blood and abdominal cramping.  Denies fever/chills.  No recent changes in his medications, no changes in dietary habits.  In the ED, temperature 98.9 F, HR 108, RR 16, BP 131/88, SPO2 100% on room air.  Sodium 129, potassium 4.1, chloride 91, CO2 19, BUN 49, creatinine 3.05, glucose 153.  Anion gap 19.  AST 27, ALT 19, total bilirubin 0.9.  Lipase 19.  WBC 16.0, hemoglobin 18.0, platelets 331.  Lactic acid 2.9.  Covid-19 PCR negative.  Influenza A/B PCR negative.  CT abdomen/pelvis with suspected infectious/inflammatory colitis involving ascending colon and rectosigmoid colon without pneumatosis, no drainable fluid/abscess or free air.  Patient was started on IV Zosyn.  TRH consulted for further evaluation and management of infectious diarrhea.   Assessment & Plan:   Principal Problem:   Intestinal infection due to enteroinvasive E. coli Active Problems:   Infectious colitis   AKI (acute kidney injury) (HCC)   High anion gap metabolic acidosis   Hyponatremia   Enteroinvasive E. coli colitis Lactic acidosis Patient presenting to the ED with progressive nausea/vomiting, diarrhea over the last 2 days.  Recently returned from a trip to [place] with several individuals with similar symptoms.  Patient was afebrile with elevated WBC count of 16.0 and a lactic acid of 2.9.  CT abdomen/pelvis with suspected infectious/inflammatory colitis involving ascending colon and rectosigmoid colon without pneumatosis, no drainable  fluid/abscess or free air.  C. difficile PCR negative.  GI PCR positive for Shigella/enteroinvasive E. coli. --WBC 16.0>8.1 --NS at 125 mL/h --Zosyn 3.375 g IV every 8 hours --Clear liquid diet, will slowly advance as tolerates --Enteric precautions --Strict I's and O's, monitor stool output --Supportive care, antiemetics, pain control --CBC daily; repeat lactic acid in a.m.  Acute renal failure High anion gap metabolic acidosis: Resolved Patient presenting with creatinine 3.05; no baseline available for review in EMR.  No previous history of CKD reported.  Etiology likely secondary to prerenal azotemia in the setting of severe dehydration from diarrhea E. coli colitis as above. --Cr 3.05>1.79 --Avoid nephrotoxins, renal dose all medications --Follow renal function daily  Hyponatremia Sodium 129 on admission, likely secondary to hypovolemia, true Mia in the setting of dehydration as above. --Na 129>133 --Continue IV fluids as above --Repeat BMP in a.m.  ADHD: Continue Adderall 60 mg p.o. daily   DVT prophylaxis: enoxaparin (LOVENOX) injection 40 mg Start: 09/16/21 0800   Code Status: Full Code Family Communication: No family present at bedside this morning  Disposition Plan:  Level of care: Med-Surg Status is: Inpatient  Remains inpatient appropriate because: Continued severe diet diarrhea, nausea, needs for IV antibiotics, IV fluid hydration, IV pain/antiemetics   Consultants:  None  Procedures:  None  Antimicrobials:  Zosyn 10/29>>   Subjective: Patient seen examined at bedside, resting comfortably.  Continues with profuse diarrhea.  Continues also with some mild nausea but no further vomiting.  Currently tolerating clear liquid diet.  Continues also with some abdominal cramping to the low abdomen.  No other questions or concerns at this time.  Denies headache, no dizziness,  no fever/chills/night sweats, no current vomiting, no chest pain, palpitations, no shortness  of breath, no fatigue, no paresthesias.  No acute events overnight per nursing staff.  Objective: Vitals:   09/16/21 0215 09/16/21 0305 09/16/21 0538 09/16/21 0810  BP:  120/84 118/82 122/64  Pulse:  (!) 127 (!) 121 (!) 122  Resp: 20 20 16 15   Temp:  98 F (36.7 C) 98.2 F (36.8 C) 98.5 F (36.9 C)  TempSrc:    Oral  SpO2:  99% 100% 100%  Weight:  73.6 kg    Height:        Intake/Output Summary (Last 24 hours) at 09/16/2021 1013 Last data filed at 09/16/2021 0600 Gross per 24 hour  Intake 2732.73 ml  Output --  Net 2732.73 ml   Filed Weights   09/15/21 1536 09/16/21 0305  Weight: 70.3 kg 73.6 kg    Examination:  General exam: Appears calm and comfortable  Respiratory system: Clear to auscultation. Respiratory effort normal.  On room air Cardiovascular system: S1 & S2 heard, RRR. No JVD, murmurs, rubs, gallops or clicks. No pedal edema. Gastrointestinal system: Abdomen is nondistended, soft, mild lower quadrant tenderness. No organomegaly or masses felt. Normal bowel sounds heard. Central nervous system: Alert and oriented. No focal neurological deficits. Extremities: Symmetric 5 x 5 power. Skin: No rashes, lesions or ulcers Psychiatry: Judgement and insight appear normal. Mood & affect appropriate.     Data Reviewed: I have personally reviewed following labs and imaging studies  CBC: Recent Labs  Lab 09/15/21 1631 09/16/21 0459  WBC 16.0* 8.1  HGB 18.0* 16.2  HCT 52.2* 47.6  MCV 77.1* 75.7*  PLT 331 291   Basic Metabolic Panel: Recent Labs  Lab 09/15/21 1631 09/16/21 0459  NA 129* 133*  K 4.1 4.5  CL 91* 98  CO2 19* 28  GLUCOSE 153* 133*  BUN 49* 45*  CREATININE 3.05* 1.79*  CALCIUM 8.4* 8.5*   GFR: Estimated Creatinine Clearance: 41.1 mL/min (A) (by C-G formula based on SCr of 1.79 mg/dL (H)). Liver Function Tests: Recent Labs  Lab 09/15/21 1631  AST 27  ALT 19  ALKPHOS 53  BILITOT 0.9  PROT 7.0  ALBUMIN 3.3*   Recent Labs  Lab  09/15/21 1631  LIPASE 19   No results for input(s): AMMONIA in the last 168 hours. Coagulation Profile: No results for input(s): INR, PROTIME in the last 168 hours. Cardiac Enzymes: No results for input(s): CKTOTAL, CKMB, CKMBINDEX, TROPONINI in the last 168 hours. BNP (last 3 results) No results for input(s): PROBNP in the last 8760 hours. HbA1C: No results for input(s): HGBA1C in the last 72 hours. CBG: No results for input(s): GLUCAP in the last 168 hours. Lipid Profile: No results for input(s): CHOL, HDL, LDLCALC, TRIG, CHOLHDL, LDLDIRECT in the last 72 hours. Thyroid Function Tests: No results for input(s): TSH, T4TOTAL, FREET4, T3FREE, THYROIDAB in the last 72 hours. Anemia Panel: No results for input(s): VITAMINB12, FOLATE, FERRITIN, TIBC, IRON, RETICCTPCT in the last 72 hours. Sepsis Labs: Recent Labs  Lab 09/16/21 0211  LATICACIDVEN 2.9*    Recent Results (from the past 240 hour(s))  Resp Panel by RT-PCR (Flu A&B, Covid) Nasopharyngeal Swab     Status: None   Collection Time: 09/15/21  8:11 PM   Specimen: Nasopharyngeal Swab; Nasopharyngeal(NP) swabs in vial transport medium  Result Value Ref Range Status   SARS Coronavirus 2 by RT PCR NEGATIVE NEGATIVE Final    Comment: (NOTE) SARS-CoV-2 target nucleic acids are NOT  DETECTED.  The SARS-CoV-2 RNA is generally detectable in upper respiratory specimens during the acute phase of infection. The lowest concentration of SARS-CoV-2 viral copies this assay can detect is 138 copies/mL. A negative result does not preclude SARS-Cov-2 infection and should not be used as the sole basis for treatment or other patient management decisions. A negative result may occur with  improper specimen collection/handling, submission of specimen other than nasopharyngeal swab, presence of viral mutation(s) within the areas targeted by this assay, and inadequate number of viral copies(<138 copies/mL). A negative result must be combined  with clinical observations, patient history, and epidemiological information. The expected result is Negative.  Fact Sheet for Patients:  BloggerCourse.com  Fact Sheet for Healthcare Providers:  SeriousBroker.it  This test is no t yet approved or cleared by the [place] FDA and  has been authorized for detection and/or diagnosis of SARS-CoV-2 by FDA under an Emergency Use Authorization (EUA). This EUA will remain  in effect (meaning this test can be used) for the duration of the COVID-19 declaration under Section 564(b)(1) of the Act, 21 U.S.C.section 360bbb-3(b)(1), unless the authorization is terminated  or revoked sooner.       Influenza A by PCR NEGATIVE NEGATIVE Final   Influenza B by PCR NEGATIVE NEGATIVE Final    Comment: (NOTE) The Xpert Xpress SARS-CoV-2/FLU/RSV plus assay is intended as an aid in the diagnosis of influenza from Nasopharyngeal swab specimens and should not be used as a sole basis for treatment. Nasal washings and aspirates are unacceptable for Xpert Xpress SARS-CoV-2/FLU/RSV testing.  Fact Sheet for Patients: BloggerCourse.com  Fact Sheet for Healthcare Providers: SeriousBroker.it  This test is not yet approved or cleared by the [place] FDA and has been authorized for detection and/or diagnosis of SARS-CoV-2 by FDA under an Emergency Use Authorization (EUA). This EUA will remain in effect (meaning this test can be used) for the duration of the COVID-19 declaration under Section 564(b)(1) of the Act, 21 U.S.C. section 360bbb-3(b)(1), unless the authorization is terminated or revoked.  Performed at [facility], [address] [place], [place] [address]   Gastrointestinal Panel by PCR , Stool     Status: Abnormal   Collection Time: 09/16/21  1:11 AM   Specimen: Stool  Result Value Ref Range Status   Campylobacter species NOT  DETECTED NOT DETECTED Final   Plesimonas shigelloides NOT DETECTED NOT DETECTED Final   Salmonella species NOT DETECTED NOT DETECTED Final   Yersinia enterocolitica NOT DETECTED NOT DETECTED Final   Vibrio species NOT DETECTED NOT DETECTED Final   Vibrio cholerae NOT DETECTED NOT DETECTED Final   Enteroaggregative E coli (EAEC) NOT DETECTED NOT DETECTED Final   Enteropathogenic E coli (EPEC) NOT DETECTED NOT DETECTED Final   Enterotoxigenic E coli (ETEC) NOT DETECTED NOT DETECTED Final   Shiga like toxin producing E coli (STEC) NOT DETECTED NOT DETECTED Final   Shigella/Enteroinvasive E coli (EIEC) DETECTED (A) NOT DETECTED Final    Comment: CRITICAL RESULT CALLED TO, READ BACK BY AND VERIFIED WITH: Zettie Cooley @ 0308 09/16/21 LFD    Cryptosporidium NOT DETECTED NOT DETECTED Final   Cyclospora cayetanensis NOT DETECTED NOT DETECTED Final   Entamoeba histolytica NOT DETECTED NOT DETECTED Final   Giardia lamblia NOT DETECTED NOT DETECTED Final   Adenovirus F40/41 NOT DETECTED NOT DETECTED Final   Astrovirus NOT DETECTED NOT DETECTED Final   Norovirus GI/GII NOT DETECTED NOT DETECTED Final   Rotavirus A NOT DETECTED NOT DETECTED Final   Sapovirus (  I, II, IV, and V) NOT DETECTED NOT DETECTED Final    Comment: Performed at [facility], [address] [place], [place] [address]  C Difficile Quick Screen w PCR reflex     Status: None   Collection Time: 09/16/21  1:11 AM   Specimen: Stool  Result Value Ref Range Status   C Diff antigen NEGATIVE NEGATIVE Final   C Diff toxin NEGATIVE NEGATIVE Final   C Diff interpretation No C. difficile detected.  Final    Comment: Performed at [facility], [address] [place], [place] [address]         Radiology Studies: CT ABDOMEN PELVIS WO CONTRAST  Result Date: 09/16/2021 CLINICAL DATA:  Vomiting, diarrhea EXAM: CT ABDOMEN AND PELVIS WITHOUT CONTRAST TECHNIQUE: Multidetector CT imaging of the abdomen and pelvis  was performed following the standard protocol without IV contrast. COMPARISON:  11/12/2011. FINDINGS: Lower chest: Lung bases are clear. Hepatobiliary: Unenhanced liver is unremarkable. Mild layering gallbladder sludge (series 2/image 33). No intrahepatic or extrahepatic ductal dilatation. Pancreas: Within normal limits. Spleen: Within normal limits. Adrenals/Urinary Tract: Adrenal glands are within normal limits. Kidneys are within normal limits. No renal calculi or hydronephrosis. Mildly thick-walled bladder, although underdistended. Stomach/Bowel: Stomach is notable for a tiny hiatal hernia. No evidence of bowel obstruction. Normal appendix (series 2/image 65). Long segment wall thickening involving the ascending colon, mildly masslike (series 2/image 63), but favoring colitis given the long segment involvement. Mild pericolonic inflammation without pneumatosis, drainable fluid collection/abscess, or free air. Additional long segment wall thickening with a haustra appearance of the rectosigmoid colon, most prominent in the lower rectum (series 2/image 82), also suggesting infectious/inflammatory colitis. Vascular/Lymphatic: No evidence of abdominal aortic aneurysm. No suspicious abdominopelvic lymphadenopathy. Reproductive: Prostate is unremarkable. Other: No abdominopelvic ascites. Small fat containing left inguinal hernia. Moderate fat containing right inguinal hernia containing trace fluid. Musculoskeletal: Mild degenerative changes of the visualized thoracolumbar spine. IMPRESSION: Suspected infectious/inflammatory colitis involving the ascending colon and rectosigmoid colon. No pneumatosis, drainable fluid collection/abscess, or free air. Although this appearance is not considered highly suspicious for underlying mass, follow-up colonoscopy is suggested. Electronically Signed   By: Charline Bills M.D.   On: 09/16/2021 01:11        Scheduled Meds:  enoxaparin (LOVENOX) injection  40 mg Subcutaneous  Q24H   Continuous Infusions:  sodium chloride 125 mL/hr at 09/16/21 0339   piperacillin-tazobactam (ZOSYN)  IV 3.375 g (09/16/21 0816)     LOS: 0 days    Time spent: 45 minutes spent on chart review, discussion with nursing staff, consultants, updating family and interview/physical exam; more than 50% of that time was spent in counseling and/or coordination of care.    Alvira Philips [place], DO [facility] Available via Epic secure chat 7am-7pm After these hours, please refer to coverage provider listed on [email or web address] 09/16/2021, 10:13 AM

## 2021-09-16 NOTE — Progress Notes (Signed)
  Chaplain On-Call responded to Rapid Response notification at 1727 hours.  The patient was being attended by the medical team and was unavailable for a visit.  Chaplain will be available as needed for further support.  Chaplain Evelena Peat M.Div., [facility]

## 2021-09-17 LAB — CBC
HCT: 41.9 % (ref 39.0–52.0)
Hemoglobin: 14.2 g/dL (ref 13.0–17.0)
MCH: 25.5 pg — ABNORMAL LOW (ref 26.0–34.0)
MCHC: 33.9 g/dL (ref 30.0–36.0)
MCV: 75.4 fL — ABNORMAL LOW (ref 80.0–100.0)
Platelets: 229 10*3/uL (ref 150–400)
RBC: 5.56 MIL/uL (ref 4.22–5.81)
RDW: 13.4 % (ref 11.5–15.5)
WBC: 6.7 10*3/uL (ref 4.0–10.5)
nRBC: 0 % (ref 0.0–0.2)

## 2021-09-17 LAB — MAGNESIUM: Magnesium: 2 mg/dL (ref 1.7–2.4)

## 2021-09-17 LAB — BASIC METABOLIC PANEL
Anion gap: 8 (ref 5–15)
BUN: 15 mg/dL (ref 8–23)
CO2: 26 mmol/L (ref 22–32)
Calcium: 8 mg/dL — ABNORMAL LOW (ref 8.9–10.3)
Chloride: 98 mmol/L (ref 98–111)
Creatinine, Ser: 0.98 mg/dL (ref 0.61–1.24)
GFR, Estimated: >60 mL/min (ref >60)
Glucose, Bld: 117 mg/dL — ABNORMAL HIGH (ref 70–99)
Potassium: 3.4 mmol/L — ABNORMAL LOW (ref 3.5–5.1)
Sodium: 132 mmol/L — ABNORMAL LOW (ref 135–145)

## 2021-09-17 LAB — STOOL CULTURE

## 2021-09-17 MED ORDER — SODIUM CHLORIDE 0.9 % IV SOLN
2.0000 g | INTRAVENOUS | Status: DC
  Administered 2021-09-17 – 2021-09-19 (×3): 2 g via INTRAVENOUS
  Filled 2021-09-17 (×2): qty 2
  Filled 2021-09-17: qty 20
  Filled 2021-09-17: qty 2

## 2021-09-17 MED ORDER — LEVOFLOXACIN IN D5W 500 MG/100ML IV SOLN
500.0000 mg | INTRAVENOUS | Status: DC
  Administered 2021-09-17: 500 mg via INTRAVENOUS
  Filled 2021-09-17 (×2): qty 100

## 2021-09-17 MED ORDER — POTASSIUM CHLORIDE CRYS ER 20 MEQ PO TBCR
40.0000 meq | EXTENDED_RELEASE_TABLET | Freq: Once | ORAL | Status: AC
  Administered 2021-09-17: 40 meq via ORAL
  Filled 2021-09-17: qty 2

## 2021-09-17 MED ORDER — LORAZEPAM 2 MG/ML IJ SOLN
0.5000 mg | Freq: Four times a day (QID) | INTRAMUSCULAR | Status: DC | PRN
  Administered 2021-09-17 – 2021-09-20 (×5): 0.5 mg via INTRAVENOUS
  Filled 2021-09-17 (×5): qty 1

## 2021-09-17 NOTE — Plan of Care (Signed)
  Problem: Health Behavior/Discharge Planning: Goal: Ability to manage health-related needs will improve Outcome: Progressing   Problem: Clinical Measurements: Goal: Ability to maintain clinical measurements within normal limits will improve Outcome: Progressing   Problem: Clinical Measurements: Goal: Will remain free from infection Outcome: Progressing   Problem: Activity: Goal: Risk for activity intolerance will decrease Outcome: Progressing   Problem: Coping: Goal: Level of anxiety will decrease Outcome: Progressing   Problem: Pain Managment: Goal: General experience of comfort will improve Outcome: Progressing

## 2021-09-17 NOTE — Progress Notes (Signed)
PROGRESS NOTE    LIOR HOEN  HYI:[id number] DOB: 09/25/1956 DOA: 09/16/2021 PCP: Clinic, [facility]    Brief Narrative:  Victor Berger is a 65 year old male with past medical history significant for ADHD who presented to [facility] ED on 8/28 with persistent nausea/vomiting, diarrhea x2 days.  Recently returned from trip to [place] with several individuals developing similar symptoms.  Patient also reports diarrhea progressively worsening now with streaks of blood and abdominal cramping.  Denies fever/chills.  No recent changes in his medications, no changes in dietary habits.  In the ED, temperature 98.9 F, HR 108, RR 16, BP 131/88, SPO2 100% on room air.  Sodium 129, potassium 4.1, chloride 91, CO2 19, BUN 49, creatinine 3.05, glucose 153.  Anion gap 19.  AST 27, ALT 19, total bilirubin 0.9.  Lipase 19.  WBC 16.0, hemoglobin 18.0, platelets 331.  Lactic acid 2.9.  Covid-19 PCR negative.  Influenza A/B PCR negative.  CT abdomen/pelvis with suspected infectious/inflammatory colitis involving ascending colon and rectosigmoid colon without pneumatosis, no drainable fluid/abscess or free air.  Patient was started on IV Zosyn.  TRH consulted for further evaluation and management of infectious diarrhea.   Assessment & Plan:   Principal Problem:   Intestinal infection due to enteroinvasive E. coli Active Problems:   Infectious colitis   AKI (acute kidney injury) (HCC)   High anion gap metabolic acidosis   Hyponatremia   Enteroinvasive E. coli colitis Lactic acidosis Patient presenting to the ED with progressive nausea/vomiting, diarrhea over the last 2 days.  Recently returned from a trip to [place] with several individuals with similar symptoms.  Patient was afebrile with elevated WBC count of 16.0 and a lactic acid of 2.9.  CT abdomen/pelvis with suspected infectious/inflammatory colitis involving ascending colon and rectosigmoid colon without pneumatosis, no drainable  fluid/abscess or free air.  C. difficile PCR negative.  GI PCR positive for Shigella/enteroinvasive E. coli. --WBC 16.0>8.1>6.7 --stool culture: pending --NS at 75 mL/h --DC Zosyn --start Levaquin 500mg  IV q24h and Ceftriaxone 2g IV q24h until stool culture returns --Full liquid diet, will slowly advance as tolerates --Enteric precautions --Strict I's and O's, monitor stool output --Supportive care, antiemetics, pain control --CBC daily; repeat lactic acid in a.m.  Acute renal failure High anion gap metabolic acidosis: Resolved Patient presenting with creatinine 3.05; no baseline available for review in EMR.  No previous history of CKD reported.  Etiology likely secondary to prerenal azotemia in the setting of severe dehydration from diarrhea E. coli colitis as above. --Cr 3.05>1.79>0.98 --Avoid nephrotoxins, renal dose all medications --continue IVF hydration as above --Follow renal function daily  Hyponatremia Sodium 129 on admission, likely secondary to hypovolemia, true Mia in the setting of dehydration as above. --Na --Continue IV fluids as above --Repeat BMP in a.m.  ADHD: Continue Adderall 60 mg p.o. daily   DVT prophylaxis: enoxaparin (LOVENOX) injection 40 mg Start: 09/16/21 0800   Code Status: Full Code Family Communication: Updated family present at bedside this morning.  Disposition Plan:  Level of care: Med-Surg Status is: Inpatient  Remains inpatient appropriate because: Continued severe diet diarrhea, nausea, needs for IV antibiotics, IV fluid hydration, IV pain/antiemetics   Consultants:  None  Procedures:  None  Antimicrobials:  Zosyn 10/29 - 10/30 Levaquin 10/30>> Ceftriaxone 10/30>>   Subjective: Patient seen examined at bedside, resting comfortably.  Family present at bedside, updated. Continues with profuse diarrhea, about once an hour.  Continues also with some mild nausea but no further  vomiting.  Currently tolerating clear  liquid diet and wishes to advance to full liquids this morning.  Continues also with some abdominal cramping to the low abdomen.  No other questions or concerns at this time.  Denies headache, no dizziness, no fever/chills/night sweats, no current vomiting, no chest pain, palpitations, no shortness of breath, no fatigue, no paresthesias.  No acute events overnight per nursing staff.  Objective: Vitals:   09/17/21 0059 09/17/21 0540 09/17/21 0821 09/17/21 0959  BP: (!) 108/58 107/67 126/66 108/66  Pulse: (!) 114 (!) 110 (!) 113 (!) 121  Resp: 20 20 20 20   Temp: 99.2 F (37.3 C) 98.4 F (36.9 C) 98 F (36.7 C) 98 F (36.7 C)  TempSrc: Oral     SpO2: 99% 99% 99% 100%  Weight:      Height:        Intake/Output Summary (Last 24 hours) at 09/17/2021 1232 Last data filed at 09/17/2021 0547 Gross per 24 hour  Intake 1121.75 ml  Output 1 ml  Net 1120.75 ml   Filed Weights   09/15/21 1536 09/16/21 0305  Weight: 70.3 kg 73.6 kg    Examination:  General exam: Appears calm and comfortable  Respiratory system: Clear to auscultation. Respiratory effort normal.  On room air Cardiovascular system: S1 & S2 heard, RRR. No JVD, murmurs, rubs, gallops or clicks. No pedal edema. Gastrointestinal system: Abdomen is nondistended, soft, mild lower quadrant tenderness. No organomegaly or masses felt. Normal bowel sounds heard. Central nervous system: Alert and oriented. No focal neurological deficits. Extremities: Symmetric 5 x 5 power. Skin: No rashes, lesions or ulcers Psychiatry: Judgement and insight appear normal. Mood & affect appropriate.     Data Reviewed: I have personally reviewed following labs and imaging studies  CBC: Recent Labs  Lab 09/15/21 1631 09/16/21 0459 09/17/21 0526  WBC 16.0* 8.1 6.7  HGB 18.0* 16.2 14.2  HCT 52.2* 47.6 41.9  MCV 77.1* 75.7* 75.4*  PLT 331 291 229   Basic Metabolic Panel: Recent Labs  Lab 09/15/21 1631 09/16/21 0459 09/17/21 0526  NA 129*  133* 132*  K 4.1 4.5 3.4*  CL 91* 98 98  CO2 19* 28 26  GLUCOSE 153* 133* 117*  BUN 49* 45* 15  CREATININE 3.05* 1.79* 0.98  CALCIUM 8.4* 8.5* 8.0*  MG  --   --  2.0   GFR: Estimated Creatinine Clearance: 75.1 mL/min (by C-G formula based on SCr of 0.98 mg/dL). Liver Function Tests: Recent Labs  Lab 09/15/21 1631  AST 27  ALT 19  ALKPHOS 53  BILITOT 0.9  PROT 7.0  ALBUMIN 3.3*   Recent Labs  Lab 09/15/21 1631  LIPASE 19   No results for input(s): AMMONIA in the last 168 hours. Coagulation Profile: No results for input(s): INR, PROTIME in the last 168 hours. Cardiac Enzymes: No results for input(s): CKTOTAL, CKMB, CKMBINDEX, TROPONINI in the last 168 hours. BNP (last 3 results) No results for input(s): PROBNP in the last 8760 hours. HbA1C: No results for input(s): HGBA1C in the last 72 hours. CBG: No results for input(s): GLUCAP in the last 168 hours. Lipid Profile: No results for input(s): CHOL, HDL, LDLCALC, TRIG, CHOLHDL, LDLDIRECT in the last 72 hours. Thyroid Function Tests: No results for input(s): TSH, T4TOTAL, FREET4, T3FREE, THYROIDAB in the last 72 hours. Anemia Panel: No results for input(s): VITAMINB12, FOLATE, FERRITIN, TIBC, IRON, RETICCTPCT in the last 72 hours. Sepsis Labs: Recent Labs  Lab 09/16/21 0211  LATICACIDVEN 2.9*  Recent Results (from the past 240 hour(s))  Resp Panel by RT-PCR (Flu A&B, Covid) Nasopharyngeal Swab     Status: None   Collection Time: 09/15/21  8:11 PM   Specimen: Nasopharyngeal Swab; Nasopharyngeal(NP) swabs in vial transport medium  Result Value Ref Range Status   SARS Coronavirus 2 by RT PCR NEGATIVE NEGATIVE Final    Comment: (NOTE) SARS-CoV-2 target nucleic acids are NOT DETECTED.  The SARS-CoV-2 RNA is generally detectable in upper respiratory specimens during the acute phase of infection. The lowest concentration of SARS-CoV-2 viral copies this assay can detect is 138 copies/mL. A negative result does not  preclude SARS-Cov-2 infection and should not be used as the sole basis for treatment or other patient management decisions. A negative result may occur with  improper specimen collection/handling, submission of specimen other than nasopharyngeal swab, presence of viral mutation(s) within the areas targeted by this assay, and inadequate number of viral copies(<138 copies/mL). A negative result must be combined with clinical observations, patient history, and epidemiological information. The expected result is Negative.  Fact Sheet for Patients:  BloggerCourse.com  Fact Sheet for Healthcare Providers:  SeriousBroker.it  This test is no t yet approved or cleared by the [place] FDA and  has been authorized for detection and/or diagnosis of SARS-CoV-2 by FDA under an Emergency Use Authorization (EUA). This EUA will remain  in effect (meaning this test can be used) for the duration of the COVID-19 declaration under Section 564(b)(1) of the Act, 21 U.S.C.section 360bbb-3(b)(1), unless the authorization is terminated  or revoked sooner.       Influenza A by PCR NEGATIVE NEGATIVE Final   Influenza B by PCR NEGATIVE NEGATIVE Final    Comment: (NOTE) The Xpert Xpress SARS-CoV-2/FLU/RSV plus assay is intended as an aid in the diagnosis of influenza from Nasopharyngeal swab specimens and should not be used as a sole basis for treatment. Nasal washings and aspirates are unacceptable for Xpert Xpress SARS-CoV-2/FLU/RSV testing.  Fact Sheet for Patients: BloggerCourse.com  Fact Sheet for Healthcare Providers: SeriousBroker.it  This test is not yet approved or cleared by the [place] FDA and has been authorized for detection and/or diagnosis of SARS-CoV-2 by FDA under an Emergency Use Authorization (EUA). This EUA will remain in effect (meaning this test can be used) for the  duration of the COVID-19 declaration under Section 564(b)(1) of the Act, 21 U.S.C. section 360bbb-3(b)(1), unless the authorization is terminated or revoked.  Performed at [facility], [address] [place], [place] [address]   Gastrointestinal Panel by PCR , Stool     Status: Abnormal   Collection Time: 09/16/21  1:11 AM   Specimen: Stool  Result Value Ref Range Status   Campylobacter species NOT DETECTED NOT DETECTED Final   Plesimonas shigelloides NOT DETECTED NOT DETECTED Final   Salmonella species NOT DETECTED NOT DETECTED Final   Yersinia enterocolitica NOT DETECTED NOT DETECTED Final   Vibrio species NOT DETECTED NOT DETECTED Final   Vibrio cholerae NOT DETECTED NOT DETECTED Final   Enteroaggregative E coli (EAEC) NOT DETECTED NOT DETECTED Final   Enteropathogenic E coli (EPEC) NOT DETECTED NOT DETECTED Final   Enterotoxigenic E coli (ETEC) NOT DETECTED NOT DETECTED Final   Shiga like toxin producing E coli (STEC) NOT DETECTED NOT DETECTED Final   Shigella/Enteroinvasive E coli (EIEC) DETECTED (A) NOT DETECTED Final    Comment: CRITICAL RESULT CALLED TO, READ BACK BY AND VERIFIED WITHZettie Cooley @ 5643 09/16/21 LFD    Cryptosporidium  NOT DETECTED NOT DETECTED Final   Cyclospora cayetanensis NOT DETECTED NOT DETECTED Final   Entamoeba histolytica NOT DETECTED NOT DETECTED Final   Giardia lamblia NOT DETECTED NOT DETECTED Final   Adenovirus F40/41 NOT DETECTED NOT DETECTED Final   Astrovirus NOT DETECTED NOT DETECTED Final   Norovirus GI/GII NOT DETECTED NOT DETECTED Final   Rotavirus A NOT DETECTED NOT DETECTED Final   Sapovirus (I, II, IV, and V) NOT DETECTED NOT DETECTED Final    Comment: Performed at [facility], [address] [place], [place] [address]  C Difficile Quick Screen w PCR reflex     Status: None   Collection Time: 09/16/21  1:11 AM   Specimen: Stool  Result Value Ref Range Status   C Diff antigen NEGATIVE NEGATIVE Final    C Diff toxin NEGATIVE NEGATIVE Final   C Diff interpretation No C. difficile detected.  Final    Comment: Performed at [facility], [address] [place], [place] [address]         Radiology Studies: CT ABDOMEN PELVIS WO CONTRAST  Result Date: 09/16/2021 CLINICAL DATA:  Vomiting, diarrhea EXAM: CT ABDOMEN AND PELVIS WITHOUT CONTRAST TECHNIQUE: Multidetector CT imaging of the abdomen and pelvis was performed following the standard protocol without IV contrast. COMPARISON:  11/12/2011. FINDINGS: Lower chest: Lung bases are clear. Hepatobiliary: Unenhanced liver is unremarkable. Mild layering gallbladder sludge (series 2/image 33). No intrahepatic or extrahepatic ductal dilatation. Pancreas: Within normal limits. Spleen: Within normal limits. Adrenals/Urinary Tract: Adrenal glands are within normal limits. Kidneys are within normal limits. No renal calculi or hydronephrosis. Mildly thick-walled bladder, although underdistended. Stomach/Bowel: Stomach is notable for a tiny hiatal hernia. No evidence of bowel obstruction. Normal appendix (series 2/image 65). Long segment wall thickening involving the ascending colon, mildly masslike (series 2/image 63), but favoring colitis given the long segment involvement. Mild pericolonic inflammation without pneumatosis, drainable fluid collection/abscess, or free air. Additional long segment wall thickening with a haustra appearance of the rectosigmoid colon, most prominent in the lower rectum (series 2/image 82), also suggesting infectious/inflammatory colitis. Vascular/Lymphatic: No evidence of abdominal aortic aneurysm. No suspicious abdominopelvic lymphadenopathy. Reproductive: Prostate is unremarkable. Other: No abdominopelvic ascites. Small fat containing left inguinal hernia. Moderate fat containing right inguinal hernia containing trace fluid. Musculoskeletal: Mild degenerative changes of the visualized thoracolumbar spine. IMPRESSION: Suspected  infectious/inflammatory colitis involving the ascending colon and rectosigmoid colon. No pneumatosis, drainable fluid collection/abscess, or free air. Although this appearance is not considered highly suspicious for underlying mass, follow-up colonoscopy is suggested. Electronically Signed   By: Charline Bills M.D.   On: 09/16/2021 01:11        Scheduled Meds:  amphetamine-dextroamphetamine  30 mg Oral BID WC   enoxaparin (LOVENOX) injection  40 mg Subcutaneous Q24H   Continuous Infusions:  sodium chloride 75 mL/hr at 09/17/21 1039   piperacillin-tazobactam (ZOSYN)  IV 3.375 g (09/17/21 1044)     LOS: 1 day    Time spent: 39 minutes spent on chart review, discussion with nursing staff, consultants, updating family and interview/physical exam; more than 50% of that time was spent in counseling and/or coordination of care.    Alvira Philips [place], DO [facility] Available via Epic secure chat 7am-7pm After these hours, please refer to coverage provider listed on [email or web address] 09/17/2021, 12:32 PM

## 2021-09-17 NOTE — Progress Notes (Signed)
   09/16/21 0314  Assess: MEWS Score  Level of Consciousness Alert  Assess: MEWS Score  MEWS Temp 0  MEWS Systolic 0  MEWS Pulse 2  MEWS RR 0  MEWS LOC 0  MEWS Score 2  MEWS Score Color Yellow  Assess: if the MEWS score is Yellow or Red  Were vital signs taken at a resting state? Yes  Focused Assessment No change from prior assessment  Does the patient meet 2 or more of the SIRS criteria? No  MEWS guidelines implemented *See Row Information* Yes  Treat  MEWS Interventions Other (Comment) (continue mews protocol)  Take Vital Signs  Increase Vital Sign Frequency  Yellow: Q 2hr X 2 then Q 4hr X 2, if remains yellow, continue Q 4hrs  Escalate  MEWS: Escalate Yellow: discuss with charge nurse/RN and consider discussing with provider and RRT  Notify: Charge Nurse/RN  Name of Charge Nurse/RN Notified Garry Heater, RN  Date Charge Nurse/RN Notified 09/16/21  Time Charge Nurse/RN Notified 0310  Notify: Provider  Provider Name/Title Manuela Schwartz  Date Provider Notified 09/16/21  Time Provider Notified 0310  Notification Type Page  Notification Reason Critical result  Provider response No new orders  Date of Provider Response 09/16/21  Document  Patient Outcome Other (Comment) (monitor)

## 2021-09-18 LAB — CBC
HCT: 41.7 % (ref 39.0–52.0)
Hemoglobin: 13.6 g/dL (ref 13.0–17.0)
MCH: 24.9 pg — ABNORMAL LOW (ref 26.0–34.0)
MCHC: 32.6 g/dL (ref 30.0–36.0)
MCV: 76.4 fL — ABNORMAL LOW (ref 80.0–100.0)
Platelets: 234 10*3/uL (ref 150–400)
RBC: 5.46 MIL/uL (ref 4.22–5.81)
RDW: 13.6 % (ref 11.5–15.5)
WBC: 5.3 10*3/uL (ref 4.0–10.5)
nRBC: 0 % (ref 0.0–0.2)

## 2021-09-18 LAB — BASIC METABOLIC PANEL
Anion gap: 8 (ref 5–15)
BUN: 9 mg/dL (ref 8–23)
CO2: 27 mmol/L (ref 22–32)
Calcium: 8.2 mg/dL — ABNORMAL LOW (ref 8.9–10.3)
Chloride: 102 mmol/L (ref 98–111)
Creatinine, Ser: 0.79 mg/dL (ref 0.61–1.24)
GFR, Estimated: >60 mL/min (ref >60)
Glucose, Bld: 102 mg/dL — ABNORMAL HIGH (ref 70–99)
Potassium: 3.9 mmol/L (ref 3.5–5.1)
Sodium: 137 mmol/L (ref 135–145)

## 2021-09-18 LAB — MAGNESIUM: Magnesium: 1.9 mg/dL (ref 1.7–2.4)

## 2021-09-18 NOTE — Plan of Care (Signed)
  Problem: Education: Goal: Knowledge of General Education information will improve Description: Including pain rating scale, medication(s)/side effects and non-pharmacologic comfort measures Outcome: Progressing   Problem: Health Behavior/Discharge Planning: Goal: Ability to manage health-related needs will improve Outcome: Progressing   Problem: Clinical Measurements: Goal: Ability to maintain clinical measurements within normal limits will improve Outcome: Progressing Goal: Will remain free from infection Outcome: Progressing Goal: Diagnostic test results will improve Outcome: Progressing Goal: Respiratory complications will improve Outcome: Progressing Goal: Cardiovascular complication will be avoided Outcome: Progressing   Problem: Activity: Goal: Risk for activity intolerance will decrease Outcome: Progressing   Problem: Nutrition: Goal: Adequate nutrition will be maintained Outcome: Progressing   Problem: Coping: Goal: Level of anxiety will decrease Outcome: Progressing   Problem: Elimination: Goal: Will not experience complications related to bowel motility Outcome: Progressing Goal: Will not experience complications related to urinary retention Outcome: Progressing   Problem: Pain Managment: Goal: General experience of comfort will improve Outcome: Progressing   Problem: Safety: Goal: Ability to remain free from injury will improve Outcome: Progressing   Problem: Skin Integrity: Goal: Risk for impaired skin integrity will decrease Outcome: Progressing   Problem: Education: Goal: Knowledge of disease and its progression will improve Outcome: Progressing   Problem: Activity: Goal: Activity intolerance will improve Outcome: Progressing   Problem: Fluid Volume: Goal: Fluid volume balance will be maintained or improved Outcome: Progressing   Problem: Respiratory: Goal: Respiratory symptoms related to disease process will be avoided Outcome:  Progressing   Problem: Self-Concept: Goal: Body image disturbance will be avoided or minimized Outcome: Progressing

## 2021-09-18 NOTE — Progress Notes (Signed)
PROGRESS NOTE    CRISTIEN JELLISON  [id number] DOB: 11/19/1956 DOA: 09/16/2021 PCP: Clinic, [facility]    Brief Narrative:  Victor Berger is a 65 year old male with past medical history significant for ADHD who presented to [facility] ED on 8/28 with persistent nausea/vomiting, diarrhea x2 days.  Recently returned from trip to [place] with several individuals developing similar symptoms.  Patient also reports diarrhea progressively worsening now with streaks of blood and abdominal cramping.  Denies fever/chills.  No recent changes in his medications, no changes in dietary habits.  In the ED, temperature 98.9 F, HR 108, RR 16, BP 131/88, SPO2 100% on room air.  Sodium 129, potassium 4.1, chloride 91, CO2 19, BUN 49, creatinine 3.05, glucose 153.  Anion gap 19.  AST 27, ALT 19, total bilirubin 0.9.  Lipase 19.  WBC 16.0, hemoglobin 18.0, platelets 331.  Lactic acid 2.9.  Covid-19 PCR negative.  Influenza A/B PCR negative.  CT abdomen/pelvis with suspected infectious/inflammatory colitis involving ascending colon and rectosigmoid colon without pneumatosis, no drainable fluid/abscess or free air.  Patient was started on IV Zosyn.  TRH consulted for further evaluation and management of infectious diarrhea.   Assessment & Plan:   Principal Problem:   Intestinal infection due to enteroinvasive E. coli Active Problems:   Infectious colitis   AKI (acute kidney injury) ([place])   High anion gap metabolic acidosis   Hyponatremia   Enteroinvasive E. coli colitis Lactic acidosis Patient presenting to the ED with progressive nausea/vomiting, diarrhea over the last 2 days.  Recently returned from a trip to [place] with several individuals with similar symptoms.  Patient was afebrile with elevated WBC count of 16.0 and a lactic acid of 2.9.  CT abdomen/pelvis with suspected infectious/inflammatory colitis involving ascending colon and rectosigmoid colon without pneumatosis, no drainable  fluid/abscess or free air.  C. difficile PCR negative.  GI PCR positive for Shigella/enteroinvasive E. coli. --WBC 16.0>8.1>6.7>5.3 --stool culture: pending --NS at 75 mL/h --Ceftriaxone 2g IV q24h until stool culture returns --Soft diet --Enteric precautions --Strict I's and O's, monitor stool output --Supportive care, antiemetics, pain control  Acute renal failure: resolved High anion gap metabolic acidosis: Resolved Patient presenting with creatinine 3.05; no baseline available for review in EMR.  No previous history of CKD reported.  Etiology likely secondary to prerenal azotemia in the setting of severe dehydration from diarrhea E. coli colitis as above. --Cr 3.05>1.79>0.98>0.79 --Avoid nephrotoxins, renal dose all medications --continue IVF hydration as above  Hyponatremia: resolved Sodium 129 on admission, likely secondary to hypovolemia, true Mia in the setting of dehydration as above. --Na 129>133>132>137 --Continue IV fluids as above  ADHD: Continue Adderall 30mg  BID   DVT prophylaxis: enoxaparin (LOVENOX) injection 40 mg Start: 09/16/21 0800   Code Status: Full Code Family Communication: No family present at bedside this morning  Disposition Plan:  Level of care: Med-Surg Status is: Inpatient  Remains inpatient appropriate because: Continued severe diet diarrhea, nausea, needs for IV antibiotics, IV fluid hydration, IV pain/antiemetics   Consultants:  None  Procedures:  None  Antimicrobials:  Zosyn 10/29 - 10/30 Levaquin 10/30 - 10/31 Ceftriaxone 10/30>>   Subjective: Patient seen examined at bedside, resting comfortably.  Family present at bedside.  Continues with profuse diarrhea, 8 reported episodes past 24 hours.  Abdominal pain and nausea improved.  Currently tolerating diet.  Advancing to soft diet today.  Continues on IV fluid hydration.  Leukocytosis and renal failure now resolved. Denies headache, no dizziness, no fever/chills/night  sweats, no  current vomiting, no chest pain, palpitations, no shortness of breath, no fatigue, no paresthesias.  No acute events overnight per nursing staff.  Objective: Vitals:   09/18/21 0031 09/18/21 0622 09/18/21 0814 09/18/21 1205  BP: 105/78 114/73 122/88 121/81  Pulse: (!) 101 96 88 94  Resp: 18 16 16 20   Temp: 97.7 F (36.5 C) 98.1 F (36.7 C) (!) 97.5 F (36.4 C) 97.8 F (36.6 C)  TempSrc: Oral Oral Oral Oral  SpO2: 92% 100% 100% 100%  Weight:      Height:        Intake/Output Summary (Last 24 hours) at 09/18/2021 1222 Last data filed at 09/18/2021 0503 Gross per 24 hour  Intake 1701.88 ml  Output --  Net 1701.88 ml   Filed Weights   09/15/21 1536 09/16/21 0305  Weight: 70.3 kg 73.6 kg    Examination:  General exam: Appears calm and comfortable  Respiratory system: Clear to auscultation. Respiratory effort normal.  On room air Cardiovascular system: S1 & S2 heard, RRR. No JVD, murmurs, rubs, gallops or clicks. No pedal edema. Gastrointestinal system: Abdomen is nondistended, soft, mild lower quadrant tenderness. No organomegaly or masses felt. Normal bowel sounds heard. Central nervous system: Alert and oriented. No focal neurological deficits. Extremities: Symmetric 5 x 5 power. Skin: No rashes, lesions or ulcers Psychiatry: Judgement and insight appear normal. Mood & affect appropriate.     Data Reviewed: I have personally reviewed following labs and imaging studies  CBC: Recent Labs  Lab 09/15/21 1631 09/16/21 0459 09/17/21 0526 09/18/21 0546  WBC 16.0* 8.1 6.7 5.3  HGB 18.0* 16.2 14.2 13.6  HCT 52.2* 47.6 41.9 41.7  MCV 77.1* 75.7* 75.4* 76.4*  PLT 331 291 229 [id number]   Basic Metabolic Panel: Recent Labs  Lab 09/15/21 1631 09/16/21 0459 09/17/21 0526 09/18/21 0546  NA 129* 133* 132* 137  K 4.1 4.5 3.4* 3.9  CL 91* 98 98 102  CO2 19* 28 26 27   GLUCOSE 153* 133* 117* 102*  BUN 49* 45* 15 9  CREATININE 3.05* 1.79* 0.98 0.79  CALCIUM 8.4* 8.5* 8.0*  8.2*  MG  --   --  2.0 1.9   GFR: Estimated Creatinine Clearance: 92.1 mL/min (by C-G formula based on SCr of 0.79 mg/dL). Liver Function Tests: Recent Labs  Lab 09/15/21 1631  AST 27  ALT 19  ALKPHOS 53  BILITOT 0.9  PROT 7.0  ALBUMIN 3.3*   Recent Labs  Lab 09/15/21 1631  LIPASE 19   No results for input(s): AMMONIA in the last 168 hours. Coagulation Profile: No results for input(s): INR, PROTIME in the last 168 hours. Cardiac Enzymes: No results for input(s): CKTOTAL, CKMB, CKMBINDEX, TROPONINI in the last 168 hours. BNP (last 3 results) No results for input(s): PROBNP in the last 8760 hours. HbA1C: No results for input(s): HGBA1C in the last 72 hours. CBG: No results for input(s): GLUCAP in the last 168 hours. Lipid Profile: No results for input(s): CHOL, HDL, LDLCALC, TRIG, CHOLHDL, LDLDIRECT in the last 72 hours. Thyroid Function Tests: No results for input(s): TSH, T4TOTAL, FREET4, T3FREE, THYROIDAB in the last 72 hours. Anemia Panel: No results for input(s): VITAMINB12, FOLATE, FERRITIN, TIBC, IRON, RETICCTPCT in the last 72 hours. Sepsis Labs: Recent Labs  Lab 09/16/21 0211  LATICACIDVEN 2.9*    Recent Results (from the past 240 hour(s))  Resp Panel by RT-PCR (Flu A&B, Covid) Nasopharyngeal Swab     Status: None   Collection Time: 09/15/21  8:11 PM   Specimen: Nasopharyngeal Swab; Nasopharyngeal(NP) swabs in vial transport medium  Result Value Ref Range Status   SARS Coronavirus 2 by RT PCR NEGATIVE NEGATIVE Final    Comment: (NOTE) SARS-CoV-2 target nucleic acids are NOT DETECTED.  The SARS-CoV-2 RNA is generally detectable in upper respiratory specimens during the acute phase of infection. The lowest concentration of SARS-CoV-2 viral copies this assay can detect is 138 copies/mL. A negative result does not preclude SARS-Cov-2 infection and should not be used as the sole basis for treatment or other patient management decisions. A negative result  may occur with  improper specimen collection/handling, submission of specimen other than nasopharyngeal swab, presence of viral mutation(s) within the areas targeted by this assay, and inadequate number of viral copies(<138 copies/mL). A negative result must be combined with clinical observations, patient history, and epidemiological information. The expected result is Negative.  Fact Sheet for Patients:  EntrepreneurPulse.com.au  Fact Sheet for Healthcare Providers:  IncredibleEmployment.be  This test is no t yet approved or cleared by the [place] FDA and  has been authorized for detection and/or diagnosis of SARS-CoV-2 by FDA under an Emergency Use Authorization (EUA). This EUA will remain  in effect (meaning this test can be used) for the duration of the COVID-19 declaration under Section 564(b)(1) of the Act, 21 U.S.C.section 360bbb-3(b)(1), unless the authorization is terminated  or revoked sooner.       Influenza A by PCR NEGATIVE NEGATIVE Final   Influenza B by PCR NEGATIVE NEGATIVE Final    Comment: (NOTE) The Xpert Xpress SARS-CoV-2/FLU/RSV plus assay is intended as an aid in the diagnosis of influenza from Nasopharyngeal swab specimens and should not be used as a sole basis for treatment. Nasal washings and aspirates are unacceptable for Xpert Xpress SARS-CoV-2/FLU/RSV testing.  Fact Sheet for Patients: EntrepreneurPulse.com.au  Fact Sheet for Healthcare Providers: IncredibleEmployment.be  This test is not yet approved or cleared by the [place] FDA and has been authorized for detection and/or diagnosis of SARS-CoV-2 by FDA under an Emergency Use Authorization (EUA). This EUA will remain in effect (meaning this test can be used) for the duration of the COVID-19 declaration under Section 564(b)(1) of the Act, 21 U.S.C. section 360bbb-3(b)(1), unless the authorization is terminated  or revoked.  Performed at [facility], [address] [place], [place] [address]   Gastrointestinal Panel by PCR , Stool     Status: Abnormal   Collection Time: 09/16/21  1:11 AM   Specimen: Stool  Result Value Ref Range Status   Campylobacter species NOT DETECTED NOT DETECTED Final   Plesimonas shigelloides NOT DETECTED NOT DETECTED Final   Salmonella species NOT DETECTED NOT DETECTED Final   Yersinia enterocolitica NOT DETECTED NOT DETECTED Final   Vibrio species NOT DETECTED NOT DETECTED Final   Vibrio cholerae NOT DETECTED NOT DETECTED Final   Enteroaggregative E coli (EAEC) NOT DETECTED NOT DETECTED Final   Enteropathogenic E coli (EPEC) NOT DETECTED NOT DETECTED Final   Enterotoxigenic E coli (ETEC) NOT DETECTED NOT DETECTED Final   Shiga like toxin producing E coli (STEC) NOT DETECTED NOT DETECTED Final   Shigella/Enteroinvasive E coli (EIEC) DETECTED (A) NOT DETECTED Final    Comment: CRITICAL RESULT CALLED TO, READ BACK BY AND VERIFIED WITH: [facility] @ [id number] 09/16/21 LFD    Cryptosporidium NOT DETECTED NOT DETECTED Final   Cyclospora cayetanensis NOT DETECTED NOT DETECTED Final   Entamoeba histolytica NOT DETECTED NOT DETECTED Final   Giardia lamblia NOT DETECTED  NOT DETECTED Final   Adenovirus F40/41 NOT DETECTED NOT DETECTED Final   Astrovirus NOT DETECTED NOT DETECTED Final   Norovirus GI/GII NOT DETECTED NOT DETECTED Final   Rotavirus A NOT DETECTED NOT DETECTED Final   Sapovirus (I, II, IV, and V) NOT DETECTED NOT DETECTED Final    Comment: Performed at [facility], [address] [place], [place] [address]  C Difficile Quick Screen w PCR reflex     Status: None   Collection Time: 09/16/21  1:11 AM   Specimen: Stool  Result Value Ref Range Status   C Diff antigen NEGATIVE NEGATIVE Final   C Diff toxin NEGATIVE NEGATIVE Final   C Diff interpretation No C. difficile detected.  Final    Comment: Performed at [facility],  [address] [place], [place] [address]  Stool culture (children & immunocomp patients)     Status: None   Collection Time: 09/16/21 11:25 AM   Specimen: Stool  Result Value Ref Range Status   Salmonella/Shigella Screen [facility]  Final    Comment: (NOTE) Test not performed. The required specimen for the test ordered was not received.      Received Room temp stool      Requires P/P Alene Mires D notified 09/17/2021    Campylobacter Culture [facility]  Final    Comment: (NOTE) Test not performed. The required specimen for the test ordered was not received.      Received Room temp stool      Requires P/P Alene Mires D notified 09/17/2021    E coli, Shiga toxin Assay [facility]  Final    Comment: (NOTE) Test not performed. The required specimen for the test ordered was not received.      Received Room temp stool      Requires P/P Alene Mires D notified 09/17/2021 Performed At: [facility] [address] [place], [place] [id number] Jolene Schimke MD DD:[phone]          Radiology Studies: No results found.      Scheduled Meds:  amphetamine-dextroamphetamine  30 mg Oral BID WC   enoxaparin (LOVENOX) injection  40 mg Subcutaneous Q24H   Continuous Infusions:  sodium chloride 75 mL/hr at 09/18/21 0137   cefTRIAXone (ROCEPHIN)  IV 2 g (09/17/21 1529)     LOS: 2 days    Time spent: 38 minutes spent on chart review, discussion with nursing staff, consultants, updating family and interview/physical exam; more than 50% of that time was spent in counseling and/or coordination of care.    Alvira Philips [place], DO [facility] Available via Epic secure chat 7am-7pm After these hours, please refer to coverage provider listed on [email or web address] 09/18/2021, 12:22 PM

## 2021-09-19 DIAGNOSIS — N179 Acute kidney failure, unspecified: Secondary | ICD-10-CM

## 2021-09-19 DIAGNOSIS — A042 Enteroinvasive Escherichia coli infection: Principal | ICD-10-CM

## 2021-09-19 DIAGNOSIS — A09 Infectious gastroenteritis and colitis, unspecified: Secondary | ICD-10-CM

## 2021-09-19 MED ORDER — AZITHROMYCIN 500 MG PO TABS
500.0000 mg | ORAL_TABLET | Freq: Every day | ORAL | Status: DC
  Administered 2021-09-19 – 2021-09-21 (×3): 500 mg via ORAL
  Filled 2021-09-19 (×3): qty 1

## 2021-09-19 MED ORDER — LOPERAMIDE HCL 2 MG PO CAPS
4.0000 mg | ORAL_CAPSULE | Freq: Once | ORAL | Status: AC
  Administered 2021-09-19: 21:00:00 4 mg via ORAL
  Filled 2021-09-19: qty 2

## 2021-09-19 MED ORDER — LOPERAMIDE HCL 2 MG PO CAPS: 2.0000 mg | ORAL_CAPSULE | Freq: Once | ORAL | Status: DC

## 2021-09-19 NOTE — Progress Notes (Signed)
PROGRESS NOTE    Victor Berger  [id number] DOB: 1956-07-09 DOA: 09/16/2021 PCP: Clinic, [facility]    Brief Narrative:  Victor Berger is a 65 year old male with past medical history significant for ADHD who presented to [facility] ED on 8/28 with persistent nausea/vomiting, diarrhea x2 days.  Recently returned from trip to [place] with several individuals developing similar symptoms.  Patient also reports diarrhea progressively worsening now with streaks of blood and abdominal cramping.  Denies fever/chills.  No recent changes in his medications, no changes in dietary habits.  In the ED, temperature 98.9 F, HR 108, RR 16, BP 131/88, SPO2 100% on room air.  Sodium 129, potassium 4.1, chloride 91, CO2 19, BUN 49, creatinine 3.05, glucose 153.  Anion gap 19.  AST 27, ALT 19, total bilirubin 0.9.  Lipase 19.  WBC 16.0, hemoglobin 18.0, platelets 331.  Lactic acid 2.9.  Covid-19 PCR negative.  Influenza A/B PCR negative.  CT abdomen/pelvis with suspected infectious/inflammatory colitis involving ascending colon and rectosigmoid colon without pneumatosis, no drainable fluid/abscess or free air.  Patient was started on IV Zosyn.  TRH consulted for further evaluation and management of infectious diarrhea.   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 coli colitis Lactic acidosis Patient presenting to the ED with progressive nausea/vomiting, diarrhea over the last 2 days.  Recently returned from a trip to [place] with several individuals with similar symptoms.  Patient was afebrile with elevated WBC count of 16.0 and a lactic acid of 2.9.  CT abdomen/pelvis with suspected infectious/inflammatory colitis involving ascending colon and rectosigmoid colon without pneumatosis, no  drainable fluid/abscess or free air.  C. difficile PCR negative.  GI PCR positive for Shigella/enteroinvasive E. coli. --WBC 16.0>8.1>6.7>5.3 --stool culture: resend today --NS at 75 mL/h --Ceftriaxone 2g IV q24h until stool culture returns --Soft diet --Enteric precautions --Strict I's and O's, monitor stool output --Supportive care, antiemetics, pain control  Acute renal failure: resolved High anion gap metabolic acidosis: Resolved Patient presenting with creatinine 3.05; no baseline available for review in EMR.  No previous history of CKD reported.  Etiology likely secondary to prerenal azotemia in the setting of severe dehydration from diarrhea E. coli colitis as above. --Cr 3.05>1.79>0.98>0.79 --Avoid nephrotoxins, renal dose all medications --continue IVF hydration as above  Hyponatremia: resolved Sodium 129 on admission, likely secondary to hypovolemia, true Mia in the setting of dehydration as above. --Na 129>133>132>137 --Continue IV fluids as above  ADHD: Continue Adderall 30mg  BID   DVT prophylaxis: enoxaparin (LOVENOX) injection 40 mg Start: 09/16/21 0800   Code Status: Full Code Family Communication: No family present at bedside this morning  Disposition Plan:  Level of care: Med-Surg Status is: Inpatient  Remains inpatient appropriate because: Continued severe diet diarrhea, nausea, needs for IV antibiotics, IV fluid hydration, IV pain/antiemetics   Consultants:  None  Procedures:  None  Antimicrobials:  Zosyn 10/29 - 10/30 Levaquin 10/30 - 10/31 Ceftriaxone 10/30>>   Subjective: Patient seen examined at bedside, resting comfortably.  Sitting in bedside chair.  No family present.  Continues with poor oral intake, although no further nausea/vomiting.  Continues with excessive diarrhea, reported 13 episodes past 24 hours.  Patient stating usually going once an hour. Denies headache, no dizziness, no fever/chills/night sweats, no current vomiting, no chest  pain, palpitations, no shortness of breath, no fatigue, no paresthesias.  No acute events overnight per nursing staff.  Objective: Vitals:   09/19/21 0012 09/19/21 0654 09/19/21 0854 09/19/21 1108  BP:  107/73 136/80 132/73  Pulse: (!) 109 100 (!) 102 88  Resp:  18 18 20   Temp:  98.4 F (36.9 C) 98.6 F (37 C) 98.9 F (37.2 C)  TempSrc:  Oral    SpO2:  100% 99% 100%  Weight:      Height:        Intake/Output Summary (Last 24 hours) at 09/19/2021 1216 Last data filed at 09/19/2021 0319 Gross per 24 hour  Intake 1770 ml  Output --  Net 1770 ml   Filed Weights   09/15/21 1536 09/16/21 0305  Weight: 70.3 kg 73.6 kg    Examination:  General exam: Appears calm and comfortable  Respiratory system: Clear to auscultation. Respiratory effort normal.  On room air Cardiovascular system: S1 & S2 heard, RRR. No JVD, murmurs, rubs, gallops or clicks. No pedal edema. Gastrointestinal system: Abdomen is nondistended, soft, mild lower quadrant tenderness. No organomegaly or masses felt. Normal bowel sounds heard. Central nervous system: Alert and oriented. No focal neurological deficits. Extremities: Symmetric 5 x 5 power. Skin: No rashes, lesions or ulcers Psychiatry: Judgement and insight appear normal. Mood & affect appropriate.     Data Reviewed: I have personally reviewed following labs and imaging studies  CBC: Recent Labs  Lab 09/15/21 1631 09/16/21 0459 09/17/21 0526 09/18/21 0546  WBC 16.0* 8.1 6.7 5.3  HGB 18.0* 16.2 14.2 13.6  HCT 52.2* 47.6 41.9 41.7  MCV 77.1* 75.7* 75.4* 76.4*  PLT 331 291 229 [id number]   Basic Metabolic Panel: Recent Labs  Lab 09/15/21 1631 09/16/21 0459 09/17/21 0526 09/18/21 0546  NA 129* 133* 132* 137  K 4.1 4.5 3.4* 3.9  CL 91* 98 98 102  CO2 19* 28 26 27   GLUCOSE 153* 133* 117* 102*  BUN 49* 45* 15 9  CREATININE 3.05* 1.79* 0.98 0.79  CALCIUM 8.4* 8.5* 8.0* 8.2*  MG  --   --  2.0 1.9   GFR: Estimated Creatinine Clearance: 92.1  mL/min (by C-G formula based on SCr of 0.79 mg/dL). Liver Function Tests: Recent Labs  Lab 09/15/21 1631  AST 27  ALT 19  ALKPHOS 53  BILITOT 0.9  PROT 7.0  ALBUMIN 3.3*   Recent Labs  Lab 09/15/21 1631  LIPASE 19   No results for input(s): AMMONIA in the last 168 hours. Coagulation Profile: No results for input(s): INR, PROTIME in the last 168 hours. Cardiac Enzymes: No results for input(s): CKTOTAL, CKMB, CKMBINDEX, TROPONINI in the last 168 hours. BNP (last 3 results) No results for input(s): PROBNP in the last 8760 hours. HbA1C: No results for input(s): HGBA1C in the last 72 hours. CBG: No results for input(s): GLUCAP in the last 168 hours. Lipid Profile: No results for input(s): CHOL, HDL, LDLCALC, TRIG, CHOLHDL, LDLDIRECT in the last 72 hours. Thyroid Function Tests: No results for input(s): TSH, T4TOTAL, FREET4, T3FREE, THYROIDAB in the last 72 hours. Anemia Panel: No results for input(s): VITAMINB12, FOLATE, FERRITIN, TIBC, IRON, RETICCTPCT in the last 72 hours. Sepsis Labs: Recent Labs  Lab 09/16/21 0211  LATICACIDVEN 2.9*    Recent Results (from the past 240 hour(s))  Resp Panel by RT-PCR (Flu A&B, Covid) Nasopharyngeal Swab     Status: None   Collection Time: 09/15/21  8:11 PM   Specimen: Nasopharyngeal Swab; Nasopharyngeal(NP) swabs in  vial transport medium  Result Value Ref Range Status   SARS Coronavirus 2 by RT PCR NEGATIVE NEGATIVE Final    Comment: (NOTE) SARS-CoV-2 target nucleic acids are NOT DETECTED.  The SARS-CoV-2 RNA is generally detectable in upper respiratory specimens during the acute phase of infection. The lowest concentration of SARS-CoV-2 viral copies this assay can detect is 138 copies/mL. A negative result does not preclude SARS-Cov-2 infection and should not be used as the sole basis for treatment or other patient management decisions. A negative result may occur with  improper specimen collection/handling, submission of  specimen other than nasopharyngeal swab, presence of viral mutation(s) within the areas targeted by this assay, and inadequate number of viral copies(<138 copies/mL). A negative result must be combined with clinical observations, patient history, and epidemiological information. The expected result is Negative.  Fact Sheet for Patients:  EntrepreneurPulse.com.au  Fact Sheet for Healthcare Providers:  IncredibleEmployment.be  This test is no t yet approved or cleared by the [place] FDA and  has been authorized for detection and/or diagnosis of SARS-CoV-2 by FDA under an Emergency Use Authorization (EUA). This EUA will remain  in effect (meaning this test can be used) for the duration of the COVID-19 declaration under Section 564(b)(1) of the Act, 21 U.S.C.section 360bbb-3(b)(1), unless the authorization is terminated  or revoked sooner.       Influenza A by PCR NEGATIVE NEGATIVE Final   Influenza B by PCR NEGATIVE NEGATIVE Final    Comment: (NOTE) The Xpert Xpress SARS-CoV-2/FLU/RSV plus assay is intended as an aid in the diagnosis of influenza from Nasopharyngeal swab specimens and should not be used as a sole basis for treatment. Nasal washings and aspirates are unacceptable for Xpert Xpress SARS-CoV-2/FLU/RSV testing.  Fact Sheet for Patients: EntrepreneurPulse.com.au  Fact Sheet for Healthcare Providers: IncredibleEmployment.be  This test is not yet approved or cleared by the [place] FDA and has been authorized for detection and/or diagnosis of SARS-CoV-2 by FDA under an Emergency Use Authorization (EUA). This EUA will remain in effect (meaning this test can be used) for the duration of the COVID-19 declaration under Section 564(b)(1) of the Act, 21 U.S.C. section 360bbb-3(b)(1), unless the authorization is terminated or revoked.  Performed at [facility], [address] [place], [place] [address]   Gastrointestinal Panel by PCR , Stool     Status: Abnormal   Collection Time: 09/16/21  1:11 AM   Specimen: Stool  Result Value Ref Range Status   Campylobacter species NOT DETECTED NOT DETECTED Final   Plesimonas shigelloides NOT DETECTED NOT DETECTED Final   Salmonella species NOT DETECTED NOT DETECTED Final   Yersinia enterocolitica NOT DETECTED NOT DETECTED Final   Vibrio species NOT DETECTED NOT DETECTED Final   Vibrio cholerae NOT DETECTED NOT DETECTED Final   Enteroaggregative E coli (EAEC) NOT DETECTED NOT DETECTED Final   Enteropathogenic E coli (EPEC) NOT DETECTED NOT DETECTED Final   Enterotoxigenic E coli (ETEC) NOT DETECTED NOT DETECTED Final   Shiga like toxin producing E coli (STEC) NOT DETECTED NOT DETECTED Final   Shigella/Enteroinvasive E coli (EIEC) DETECTED (A) NOT DETECTED Final    Comment: CRITICAL RESULT CALLED TO, READ BACK BY AND VERIFIED WITH: Earlie Server @ [id number] 09/16/21 LFD    Cryptosporidium NOT DETECTED NOT DETECTED Final   Cyclospora cayetanensis NOT DETECTED NOT DETECTED Final   Entamoeba histolytica NOT DETECTED NOT DETECTED Final   Giardia lamblia NOT DETECTED NOT DETECTED Final   Adenovirus F40/41 NOT DETECTED NOT  DETECTED Final   Astrovirus NOT DETECTED NOT DETECTED Final   Norovirus GI/GII NOT DETECTED NOT DETECTED Final   Rotavirus A NOT DETECTED NOT DETECTED Final   Sapovirus (I, II, IV, and V) NOT DETECTED NOT DETECTED Final    Comment: Performed at [facility], [address] [place], [place] [address]  C Difficile Quick Screen w PCR reflex     Status: None   Collection Time: 09/16/21  1:11 AM   Specimen: Stool  Result Value Ref Range Status   C Diff antigen NEGATIVE NEGATIVE Final   C Diff toxin NEGATIVE NEGATIVE Final   C Diff interpretation No C. difficile detected.  Final    Comment: Performed at [facility], [address] [place], [place] [address]  Stool culture (children &  immunocomp patients)     Status: None   Collection Time: 09/16/21 11:25 AM   Specimen: Stool  Result Value Ref Range Status   Salmonella/Shigella Screen [facility]  Final    Comment: (NOTE) Test not performed. The required specimen for the test ordered was not received.      Received Room temp stool      Requires P/P Alene Mires D notified 09/17/2021    Campylobacter Culture [facility]  Final    Comment: (NOTE) Test not performed. The required specimen for the test ordered was not received.      Received Room temp stool      Requires P/P Alene Mires D notified 09/17/2021    E coli, Shiga toxin Assay [facility]  Final    Comment: (NOTE) Test not performed. The required specimen for the test ordered was not received.      Received Room temp stool      Requires P/P Alene Mires D notified 09/17/2021 Performed At: [facility] [address] [place], [place] [id number] Jolene Schimke MD LK:[id number]          Radiology Studies: No results found.      Scheduled Meds:  amphetamine-dextroamphetamine  30 mg Oral BID WC   enoxaparin (LOVENOX) injection  40 mg Subcutaneous Q24H   Continuous Infusions:  sodium chloride 75 mL/hr at 09/19/21 0501   cefTRIAXone (ROCEPHIN)  IV Stopped (09/18/21 1313)     LOS: 3 days    Time spent: 38 minutes spent on chart review, discussion with nursing staff, consultants, updating family and interview/physical exam; more than 50% of that time was spent in counseling and/or coordination of care.    Alvira Philips [place], DO [facility] Available via Epic secure chat 7am-7pm After these hours, please refer to coverage provider listed on [email or web address] 09/19/2021, 12:16 PM

## 2021-09-19 NOTE — Consult Note (Signed)
NAME: Victor Berger  DOB: 06-10-1956  MRN: [id number]  Date/Time: 09/19/2021 1:21 PM  REQUESTING PROVIDER: Dr.[place] Subjective:  REASON FOR CONSULT: infectious gastroenteritis ? Victor Berger is a 65 y.o. male who is healthy at baseline, presents with diarrhea /N/V Pt is a Customer service manager and had gone for a horse show in [place] ( 24/25/26) . HE returned on Wednesday 10/26/2 and next day 09/14/21 started having nausea, vomiting and diarrhea. Came to the ED on 09/15/21 Also had abdominal pain and says saw blood in stool early on but none in the past 2 days On admission vitals  temp 98.9.BP 131/88, sats 100% Labs showed wbc 16, cr 3.05, plt 331 and Hb 18. Stool tests sent and GI PCR panel showed shigella/enteroinvasive e.coli. He initially received zosyn and after 48 hrs changed to ceftriaxone. 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 No pertinent past medical history.  Past Surgical History:  Procedure Laterality Date   DENTAL SURGERY      Social History   Socioeconomic History   Marital status: Unknown    Spouse name: Not on file   Number of children: Not on file   Years of education: Not on file   Highest education level: Not on file  Occupational History   Occupation: Horse trainer  Tobacco Use   Smoking status: Every Day    Packs/day: 0.50    Years: 20.00    Pack years: 10.00    Types: Cigarettes   Smokeless tobacco: Never  Substance and Sexual Activity   Alcohol use: Yes   Drug  use: No   Sexual activity: Yes  Other Topics Concern   Not on file  Social History Narrative   Not on file   Social Determinants of Health   Financial Resource Strain: Not on file  Food Insecurity: Not on file  Transportation Needs: Not on file  Physical Activity: Not on file  Stress: Not on file  Social Connections: Not on file  Intimate Partner Violence: Not on file    Family History  Problem Relation Age of Onset   Parkinsonism Mother    Diabetes Father    Cholelithiasis Other    No Known Allergies I? Current Facility-Administered Medications  Medication Dose Route Frequency Provider Last Rate Last Admin   0.9 %  sodium chloride infusion   Intravenous Continuous [place] Eric J, DO 75 mL/hr at 09/19/21 0501 New Bag at 09/19/21 0501   acetaminophen (TYLENOL) tablet 650 mg  650 mg Oral Q6H PRN Athena Masse, MD       Or   acetaminophen (TYLENOL) suppository 650 mg  650 mg Rectal Q6H PRN Athena Masse, MD       amphetamine-dextroamphetamine (ADDERALL) tablet 30 mg  30 mg Oral BID WC [place] Donnamarie Poag, DO   30 mg at 09/17/21 1035   cefTRIAXone (ROCEPHIN) 2 g in sodium chloride 0.9 % 100 mL IVPB  2 g Intravenous Q24H [place], Eric J, DO   Stopped at 09/18/21 1313   enoxaparin (LOVENOX) injection 40 mg  40 mg Subcutaneous Q24H Andris Baumann, MD   40 mg at 09/19/21 0946   HYDROcodone-acetaminophen (NORCO/VICODIN) 5-325 MG per tablet 1-2 tablet  1-2 tablet Oral Q4H PRN Andris Baumann, MD   1 tablet at 09/18/21 1521   LORazepam (ATIVAN) injection 0.5 mg  0.5 mg Intravenous Q6H PRN [place], Eric J, DO   0.5 mg at 09/18/21 7989   morphine 2 MG/ML injection 2 mg  2 mg Intravenous Q2H PRN Andris Baumann, MD   2 mg at 09/16/21 0810   ondansetron (ZOFRAN) tablet 4 mg  4 mg Oral Q6H PRN Andris Baumann, MD       Or   ondansetron [facility]) injection 4 mg  4 mg Intravenous Q6H PRN Andris Baumann, MD         Abtx:  Anti-infectives (From admission, onward)    Start     Dose/Rate  Route Frequency Ordered Stop   09/17/21 1345  cefTRIAXone (ROCEPHIN) 2 g in sodium chloride 0.9 % 100 mL IVPB        2 g 200 mL/hr over 30 Minutes Intravenous Every 24 hours 09/17/21 1254 09/22/21 1344   09/17/21 1345  levofloxacin (LEVAQUIN) IVPB 500 mg  Status:  Discontinued        500 mg 100 mL/hr over 60 Minutes Intravenous Every 24 hours 09/17/21 1254 09/18/21 1159   09/16/21 0800  piperacillin-tazobactam (ZOSYN) IVPB 3.375 g  Status:  Discontinued        3.375 g 12.5 mL/hr over 240 Minutes Intravenous Every 8 hours 09/16/21 0247 09/17/21 1254   09/16/21 0130  piperacillin-tazobactam (ZOSYN) IVPB 3.375 g        3.375 g 100 mL/hr over 30 Minutes Intravenous  Once 09/16/21 0126 09/16/21 0212       REVIEW OF SYSTEMS:  Const: low grade  fever, negative chills, negative weight loss Eyes: negative diplopia or visual changes, negative eye pain ENT: negative coryza, negative sore throat Resp: negative cough, hemoptysis, dyspnea Cards: negative for chest pain, palpitations, lower extremity edema GU: negative for frequency, dysuria and hematuria GI:as above Skin: negative for rash and pruritus Heme: negative for easy bruising and gum/nose bleeding MS: weakness Neurolo:negative for headaches, dizziness, vertigo, memory problems  Psych: depressed about diarrhea Endocrine: negative for thyroid, diabetes Allergy/Immunology- negative for any medication or food allergies  Objective:  VITALS:  BP 138/88 (BP Location: Right Arm)   Pulse 92 Comment: manually rechecked  Temp 99.2 F (37.3 C)   Resp 18   Ht 5\' 9"  (1.753 m)   Wt 73.6 kg   SpO2 99%   BMI 23.96 kg/m  PHYSICAL EXAM:  General: Alert, cooperative, no distress, appears stated age.  Head: Normocephalic, without obvious abnormality, atraumatic. Eyes: Conjunctivae clear, anicteric sclerae. Pupils are equal ENT Nares normal. No drainage or sinus tenderness. Lips, mucosa, and tongue normal. No Thrush Neck: Supple, symmetrical,  no adenopathy, thyroid: non tender no carotid bruit and no JVD. Back: No CVA tenderness. Lungs: Clear to auscultation bilaterally. No Wheezing or Rhonchi. No rales. Heart: Regular rate and rhythm, no murmur, rub or gallop. Abdomen: Soft, non-tender,not distended. Bowel sounds normal. No masses Extremities: atraumatic, no cyanosis. No edema. No clubbing Skin: No rashes or lesions. Or bruising Lymph: Cervical, supraclavicular normal. Neurologic: Grossly non-focal Pertinent Labs Lab Results CBC    Component Value Date/Time   WBC 5.3  09/18/2021 0546   RBC 5.46 09/18/2021 0546   HGB 13.6 09/18/2021 0546   HGB 15.7 01/29/2012 1841   HCT 41.7 09/18/2021 0546   HCT 47.7 01/29/2012 1841   PLT 234 09/18/2021 0546   PLT 284 01/29/2012 1841   MCV 76.4 (L) 09/18/2021 0546   MCV 80 01/29/2012 1841   MCH 24.9 (L) 09/18/2021 0546   MCHC 32.6 09/18/2021 0546   RDW 13.6 09/18/2021 0546   RDW 13.8 01/29/2012 1841   LYMPHSABS 2.1 05/19/2012 2251   LYMPHSABS 1.3 01/29/2012 1841   MONOABS 0.7 05/19/2012 2251   MONOABS 0.6 01/29/2012 1841   EOSABS 0.3 05/19/2012 2251   EOSABS 0.1 01/29/2012 1841   BASOSABS 0.0 05/19/2012 2251   BASOSABS 0.0 01/29/2012 1841    CMP Latest Ref Rng & Units 09/18/2021 09/17/2021 09/16/2021  Glucose 70 - 99 mg/dL 102(H) 117(H) 133(H)  BUN 8 - 23 mg/dL 9 15 45(H)  Creatinine 0.61 - 1.24 mg/dL 0.79 0.98 1.79(H)  Sodium 135 - 145 mmol/L 137 132(L) 133(L)  Potassium 3.5 - 5.1 mmol/L 3.9 3.4(L) 4.5  Chloride 98 - 111 mmol/L 102 98 98  CO2 22 - 32 mmol/L 27 26 28   Calcium 8.9 - 10.3 mg/dL 8.2(L) 8.0(L) 8.5(L)  Total Protein 6.5 - 8.1 g/dL - - -  Total Bilirubin 0.3 - 1.2 mg/dL - - -  Alkaline Phos 38 - 126 U/L - - -  AST 15 - 41 U/L - - -  ALT 0 - 44 U/L - - -      Microbiology: Recent Results (from the past 240 hour(s))  Resp Panel by RT-PCR (Flu A&B, Covid) Nasopharyngeal Swab     Status: None   Collection Time: 09/15/21  8:11 PM   Specimen:  Nasopharyngeal Swab; Nasopharyngeal(NP) swabs in vial transport medium  Result Value Ref Range Status   SARS Coronavirus 2 by RT PCR NEGATIVE NEGATIVE Final    Comment: (NOTE) SARS-CoV-2 target nucleic acids are NOT DETECTED.  The SARS-CoV-2 RNA is generally detectable in upper respiratory specimens during the acute phase of infection. The lowest concentration of SARS-CoV-2 viral copies this assay can detect is 138 copies/mL. A negative result does not preclude SARS-Cov-2 infection and should not be used as the sole basis for treatment or other patient management decisions. A negative result may occur with  improper specimen collection/handling, submission of specimen other than nasopharyngeal swab, presence of viral mutation(s) within the areas targeted by this assay, and inadequate number of viral copies(<138 copies/mL). A negative result must be combined with clinical observations, patient history, and epidemiological information. The expected result is Negative.  Fact Sheet for Patients:  EntrepreneurPulse.com.au  Fact Sheet for Healthcare Providers:  IncredibleEmployment.be  This test is no t yet approved or cleared by the [place] FDA and  has been authorized for detection and/or diagnosis of SARS-CoV-2 by FDA under an Emergency Use Authorization (EUA). This EUA will remain  in effect (meaning this test can be used) for the duration of the COVID-19 declaration under Section 564(b)(1) of the Act, 21 U.S.C.section 360bbb-3(b)(1), unless the authorization is terminated  or revoked sooner.       Influenza A by PCR NEGATIVE NEGATIVE Final   Influenza B by PCR NEGATIVE NEGATIVE Final    Comment: (NOTE) The Xpert Xpress SARS-CoV-2/FLU/RSV plus assay is intended as an aid in the diagnosis of influenza from Nasopharyngeal swab specimens and should not be used as a sole basis for treatment. Nasal washings and aspirates are unacceptable  for  Xpert Xpress SARS-CoV-2/FLU/RSV testing.  Fact Sheet for Patients: EntrepreneurPulse.com.au  Fact Sheet for Healthcare Providers: IncredibleEmployment.be  This test is not yet approved or cleared by the [place] FDA and has been authorized for detection and/or diagnosis of SARS-CoV-2 by FDA under an Emergency Use Authorization (EUA). This EUA will remain in effect (meaning this test can be used) for the duration of the COVID-19 declaration under Section 564(b)(1) of the Act, 21 U.S.C. section 360bbb-3(b)(1), unless the authorization is terminated or revoked.  Performed at [facility], [address] [place], [place] [address]   Gastrointestinal Panel by PCR , Stool     Status: Abnormal   Collection Time: 09/16/21  1:11 AM   Specimen: Stool  Result Value Ref Range Status   Campylobacter species NOT DETECTED NOT DETECTED Final   Plesimonas shigelloides NOT DETECTED NOT DETECTED Final   Salmonella species NOT DETECTED NOT DETECTED Final   Yersinia enterocolitica NOT DETECTED NOT DETECTED Final   Vibrio species NOT DETECTED NOT DETECTED Final   Vibrio cholerae NOT DETECTED NOT DETECTED Final   Enteroaggregative E coli (EAEC) NOT DETECTED NOT DETECTED Final   Enteropathogenic E coli (EPEC) NOT DETECTED NOT DETECTED Final   Enterotoxigenic E coli (ETEC) NOT DETECTED NOT DETECTED Final   Shiga like toxin producing E coli (STEC) NOT DETECTED NOT DETECTED Final   Shigella/Enteroinvasive E coli (EIEC) DETECTED (A) NOT DETECTED Final    Comment: CRITICAL RESULT CALLED TO, READ BACK BY AND VERIFIED WITH: Earlie Server @ 0308 09/16/21 LFD    Cryptosporidium NOT DETECTED NOT DETECTED Final   Cyclospora cayetanensis NOT DETECTED NOT DETECTED Final   Entamoeba histolytica NOT DETECTED NOT DETECTED Final   Giardia lamblia NOT DETECTED NOT DETECTED Final   Adenovirus F40/41 NOT DETECTED NOT DETECTED Final   Astrovirus NOT DETECTED NOT  DETECTED Final   Norovirus GI/GII NOT DETECTED NOT DETECTED Final   Rotavirus A NOT DETECTED NOT DETECTED Final   Sapovirus (I, II, IV, and V) NOT DETECTED NOT DETECTED Final    Comment: Performed at [facility], [address] [place], [place] [address]  C Difficile Quick Screen w PCR reflex     Status: None   Collection Time: 09/16/21  1:11 AM   Specimen: Stool  Result Value Ref Range Status   C Diff antigen NEGATIVE NEGATIVE Final   C Diff toxin NEGATIVE NEGATIVE Final   C Diff interpretation No C. difficile detected.  Final    Comment: Performed at [facility], [address] [place], [place] [address]  Stool culture (children & immunocomp patients)     Status: None   Collection Time: 09/16/21 11:25 AM   Specimen: Stool  Result Value Ref Range Status   Salmonella/Shigella Screen [facility]  Final    Comment: (NOTE) Test not performed. The required specimen for the test ordered was not received.      Received Room temp stool      Requires P/P Raymond Gurney D notified 09/17/2021    Campylobacter Culture [facility]  Final    Comment: (NOTE) Test not performed. The required specimen for the test ordered was not received.      Received Room temp stool      Requires P/P Raymond Gurney D notified 09/17/2021    E coli, Shiga toxin Assay [facility]  Final    Comment: (NOTE) Test not performed. The required specimen for the test ordered was  not received.      Received Room temp stool      Requires P/P Raymond Gurney D notified 09/17/2021 Performed At: [facility] [address] [place], [place] [id number] Rush Farmer MD [id number]    HIV NR IMAGING RESULTS: CT abdomen Suspected infectious/inflammatory colitis involving the ascending colon and rectosigmoid colon. No pneumatosis, drainable fluid collection/abscess, or free air. I have personally reviewed the films ? Impression/Recommendation ?severe infectious gastroenteritis due  to shigella/enteroinvasive e.coli Will get stool culture to differentiate it Pt got 4 doses of zosyn, 1 dose of levaquin and 3 doses of ceftriaxone but still very symptomatic with severe diarrhea No blood in the stool now ( was present at the beginning) Will Dc ceftriaxone and put him on zithromax PO Nausea and vomiting resolved ? AKI has resolved with IV fluids Leucocytosis has resolved He has not had any labs today to check for hypokalemia  Will give 4 mg of imodium one dose as he is very symptomatic and depressed  HIV NR  Discussed the management with the patient and his nurse ?  Note:  This document was prepared using Dragon voice recognition software and may include unintentional dictation errors.

## 2021-09-20 LAB — CBC
HCT: 40.9 % (ref 39.0–52.0)
Hemoglobin: 13.6 g/dL (ref 13.0–17.0)
MCH: 26 pg (ref 26.0–34.0)
MCHC: 33.3 g/dL (ref 30.0–36.0)
MCV: 78.1 fL — ABNORMAL LOW (ref 80.0–100.0)
Platelets: 300 10*3/uL (ref 150–400)
RBC: 5.24 MIL/uL (ref 4.22–5.81)
RDW: 13.7 % (ref 11.5–15.5)
WBC: 9.1 10*3/uL (ref 4.0–10.5)
nRBC: 0 % (ref 0.0–0.2)

## 2021-09-20 LAB — BASIC METABOLIC PANEL
Anion gap: 6 (ref 5–15)
BUN: 7 mg/dL — ABNORMAL LOW (ref 8–23)
CO2: 30 mmol/L (ref 22–32)
Calcium: 8.3 mg/dL — ABNORMAL LOW (ref 8.9–10.3)
Chloride: 101 mmol/L (ref 98–111)
Creatinine, Ser: 0.91 mg/dL (ref 0.61–1.24)
GFR, Estimated: >60 mL/min (ref >60)
Glucose, Bld: 119 mg/dL — ABNORMAL HIGH (ref 70–99)
Potassium: 3.4 mmol/L — ABNORMAL LOW (ref 3.5–5.1)
Sodium: 137 mmol/L (ref 135–145)

## 2021-09-20 LAB — MAGNESIUM: Magnesium: 1.5 mg/dL — ABNORMAL LOW (ref 1.7–2.4)

## 2021-09-20 MED ORDER — MAGNESIUM SULFATE 2 GM/50ML IV SOLN
2.0000 g | Freq: Once | INTRAVENOUS | Status: AC
  Administered 2021-09-20: 10:00:00 2 g via INTRAVENOUS
  Filled 2021-09-20: qty 50

## 2021-09-20 MED ORDER — POTASSIUM CHLORIDE CRYS ER 20 MEQ PO TBCR
40.0000 meq | EXTENDED_RELEASE_TABLET | Freq: Two times a day (BID) | ORAL | Status: DC
  Administered 2021-09-20 – 2021-09-21 (×3): 40 meq via ORAL
  Filled 2021-09-20 (×3): qty 2

## 2021-09-20 NOTE — Progress Notes (Signed)
   09/19/21 2039  Assess: MEWS Score  Temp 98.8 F (37.1 C)  BP 116/62  Pulse Rate (!) 118  Resp 19  SpO2 97 %  O2 Device Room Air  Assess: MEWS Score  MEWS Temp 0  MEWS Systolic 0  MEWS Pulse 2  MEWS RR 0  MEWS LOC 0  MEWS Score 2  MEWS Score Color Yellow  Assess: if the MEWS score is Yellow or Red  Were vital signs taken at a resting state? Yes  Focused Assessment No change from prior assessment  Does the patient meet 2 or more of the SIRS criteria? Yes  Does the patient have a confirmed or suspected source of infection? No  MEWS guidelines implemented *See Row Information* No, previously yellow, continue vital signs every 4 hours  Treat  MEWS Interventions Other (Comment) (continue mew protocol)  Pain Scale 0-10  Pain Score 0  Pain Type Acute pain  Escalate  MEWS: Escalate Yellow: discuss with charge nurse/RN and consider discussing with provider and RRT  Notify: Charge Nurse/RN  Name of Charge Nurse/RN Notified Jackie,RN  Date Charge Nurse/RN Notified 09/19/21  Time Charge Nurse/RN Notified 2115  Assess: SIRS CRITERIA  SIRS Temperature  0  SIRS Pulse 1  SIRS Respirations  0  SIRS WBC 0  SIRS Score Sum  1

## 2021-09-20 NOTE — Progress Notes (Signed)
   09/20/21 0756  Assess: MEWS Score  Temp 98.8 F (37.1 C)  BP 111/61  Pulse Rate (!) 112  Resp 18  SpO2 98 %  Assess: MEWS Score  MEWS Temp 0  MEWS Systolic 0  MEWS Pulse 2  MEWS RR 0  MEWS LOC 0  MEWS Score 2  MEWS Score Color Yellow  Assess: if the MEWS score is Yellow or Red  Were vital signs taken at a resting state? Yes  Focused Assessment Change from prior assessment (see assessment flowsheet)  Does the patient meet 2 or more of the SIRS criteria? No  Does the patient have a confirmed or suspected source of infection? Yes  Provider and Rapid Response Notified? No  MEWS guidelines implemented *See Row Information* Yes  Treat  MEWS Interventions Other (Comment) (Continue MEWS protocol)  Pain Scale 0-10  Pain Score 0  Take Vital Signs  Increase Vital Sign Frequency  Yellow: Q 2hr X 2 then Q 4hr X 2, if remains yellow, continue Q 4hrs  Escalate  MEWS: Escalate Yellow: discuss with charge nurse/RN and consider discussing with provider and RRT  Notify: Charge Nurse/RN  Name of Charge Nurse/RN Notified Marchelle Folks  Date Charge Nurse/RN Notified 09/20/21  Time Charge Nurse/RN Notified 0800  Document  Patient Outcome Other (Comment) (Monitor)  Progress note created (see row info) Yes  Assess: SIRS CRITERIA  SIRS Temperature  0  SIRS Pulse 1  SIRS Respirations  0  SIRS WBC 0  SIRS Score Sum  1

## 2021-09-20 NOTE — Progress Notes (Signed)
   Date of Admission:  09/16/2021     ID: Victor Berger is a 65 y.o. 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 Active Problems:   Infectious colitis   AKI (acute kidney injury) (HCC)   High anion gap metabolic acidosis   Hyponatremia    Subjective: Pt says diarrhea reduce dby 50% compared to yesterday No pain abdomen No fever or chills  Medications:   amphetamine-dextroamphetamine  30 mg Oral BID WC   azithromycin  500 mg Oral Daily   enoxaparin (LOVENOX) injection  40 mg Subcutaneous Q24H   potassium chloride  40 mEq Oral BID    Objective: Vital signs in last 24 hours: Temp:  [97.6 F (36.4 C)-100 F (37.8 C)] 97.6 F (36.4 C) (11/02 1237) Pulse Rate:  [47-121] 101 (11/02 1237) Resp:  [18-20] 18 (11/02 1237) BP: (84-134)/(61-87) 118/74 (11/02 1237) SpO2:  [81 %-99 %] 98 % (11/02 1237)  PHYSICAL EXAM:  General: Alert, cooperative, no distress, appears stated age.  Head: Normocephalic, without obvious abnormality, atraumatic. Eyes: Conjunctivae clear, anicteric sclerae. Pupils are equal ENT Nares normal. No drainage or sinus tenderness. Lips, mucosa, and tongue normal. No Thrush Neck: Supple, symmetrical, no adenopathy, thyroid: non tender no carotid bruit and no JVD. Back: No CVA tenderness. Lungs: Clear to auscultation bilaterally. No Wheezing or Rhonchi. No rales. Heart: tachycardia Abdomen: Soft, non-tender,not distended. Bowel sounds normal. No masses Extremities: atraumatic, no cyanosis. No edema. No clubbing Skin: No rashes or lesions. Or bruising Lymph: Cervical, supraclavicular normal. Neurologic: Grossly non-focal  Lab Results Recent Labs    09/18/21 0546 09/20/21 0541  WBC 5.3 9.1  HGB 13.6 13.6  HCT 41.7 40.9  NA 137 137  K 3.9 3.4*  CL 102 101  CO2 27 30  BUN 9 7*  CREATININE 0.79 0.91   Liver Panel No results for input(s): PROT, ALBUMIN, AST, ALT, ALKPHOS, BILITOT, BILIDIR, IBILI in the last 72  hours. Sedimentation Rate No results for input(s): ESRSEDRATE in the last 72 hours. C-Reactive Protein No results for input(s): CRP in the last 72 hours.  Microbiology:  Studies/Results: No results found.   Assessment/Plan: severe infectious gastroenteritis due to shigella/enteroinvasive e.coli Pt got 4 doses of zosyn, 1 dose of levaquin and 3 doses of ceftriaxone but was still very symptomatic with severe diarrhea Changed to azithromycin PO on 09/19/21 Diarrhea 50% better today Had received 4 mg of imodium at 9pm last night ? AKI has resolved with IV fluids Leucocytosis has resolved Hypokalemia and hypomagnesemia being corrected      HIV NR  Pt needs to go on BRAT diet --bread, rice, yogurt, banana instead of macoroni/cheese/cake etc   Discussed the management with the patient and hospitalist and his nurse

## 2021-09-20 NOTE — Progress Notes (Signed)
PROGRESS NOTE    Victor Berger  TDD:[id number] DOB: 06-23-1956 DOA: 09/16/2021 PCP: Clinic, [facility]    Brief Narrative:  Victor Berger is a 65 year old male with past medical history significant for ADHD who presented to [facility] ED on 10/28 with persistent nausea/vomiting, diarrhea x2 days.  Recently returned from trip to [place] with several individuals developing similar symptoms.  Patient also reports diarrhea progressively worsening now with streaks of blood and abdominal cramping.  Denies fever/chills.  No recent changes in his medications, no changes in dietary habits.   In the ED, temperature 98.9 F, HR 108, RR 16, BP 131/88, SPO2 100% on room air.  Sodium 129, potassium 4.1, chloride 91, CO2 19, BUN 49, creatinine 3.05, glucose 153.  Anion gap 19.  AST 27, ALT 19, total bilirubin 0.9.  Lipase 19.  WBC 16.0, hemoglobin 18.0, platelets 331.  Lactic acid 2.9.  Covid-19 PCR negative.  Influenza A/B PCR negative.  CT abdomen/pelvis with suspected infectious/inflammatory colitis involving ascending colon and rectosigmoid colon without pneumatosis, no drainable fluid/abscess or free air.  Patient was started on IV Zosyn.  TRH consulted for further evaluation and management of infectious diarrhea.  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 coli colitis. Lactic acidosis Patient presenting to the ED with progressive nausea/vomiting, diarrhea over the last 2 days.  Recently returned from a trip to [place] with several individuals with similar symptoms.  Patient was afebrile with elevated WBC count of 16.0 and a lactic acid of 2.9.  CT abdomen/pelvis with suspected infectious/inflammatory colitis involving ascending colon and rectosigmoid colon without pneumatosis, no  drainable fluid/abscess or free air.  C. difficile PCR negative.  GI PCR positive for Shigella/enteroinvasive E. coli. --WBC normalized. --stool culture: Repeated stool cultures pending. --NS at 75 mL/h, continue. -- Treated with IV Zosyn and then ceftriaxone and now with azithromycin.  Clinically improving.  Tolerating diet.   --Soft diet --Enteric precautions --Strict I's and O's, monitor stool output -- X-ray is being followed by infectious disease.   Acute renal failure: resolved High anion gap metabolic acidosis: Resolved Patient presenting with creatinine 3.05; no baseline available for review in EMR.  No previous history of CKD reported.  Etiology likely secondary to prerenal azotemia in the setting of severe dehydration from diarrhea E. coli colitis as above. --Cr 3.05>1.79>0.98>0.79 --Avoid nephrotoxins, renal dose all medications --continue IVF hydration as above   Hyponatremia: resolved Sodium 129 on admission, likely secondary to hypovolemia, in the setting of dehydration as above. --Na 129>133>132>137 --Continue IV fluids as above   ADHD: Continue Adderall 30mg  BID  Hypokalemia: Persistent.  Replace aggressively today and recheck level tomorrow morning.  Hypomagnesemia: Replace aggressively today with IV magnesium and recheck levels tomorrow morning.   DVT prophylaxis: enoxaparin (LOVENOX) injection 40 mg Start: 09/16/21 0800   Code Status: Full code Family Communication: None Disposition Plan: Status is: Inpatient  Remains inpatient appropriate because: Patient still with significant diarrhea, severe electrolyte abnormalities.     Consultants:  Infectious disease  Procedures:  None  Antimicrobials:  Zosyn, ceftriaxone completed Now on azithromycin   Subjective: Patient seen and examined.  Poor historian.  He tells me that he had 6-7 episodes of diarrhea since last 24 hours but they are more well formed today.  He received 1  dose of Imodium yesterday.   Overall feels better. Denies any nausea vomiting or abdominal pain or cramping.  Feels weak.  Objective: Vitals:   09/20/21 0503 09/20/21 0756 09/20/21 1015 09/20/21 1237  BP: 116/64 111/61 112/65 118/74  Pulse: (!) 103 (!) 112 (!) 110 (!) 101  Resp: 20 18 18 18   Temp:  98.8 F (37.1 C) 98.5 F (36.9 C) 97.6 F (36.4 C)  TempSrc:   Oral   SpO2: 98% 98% 99% 98%  Weight:      Height:        Intake/Output Summary (Last 24 hours) at 09/20/2021 1333 Last data filed at 09/20/2021 1200 Gross per 24 hour  Intake 290 ml  Output 1000 ml  Net -710 ml   Filed Weights   09/15/21 1536 09/16/21 0305  Weight: 70.3 kg 73.6 kg    Examination:  General exam: Appears calm and comfortable  Respiratory system: Clear to auscultation. Respiratory effort normal. Cardiovascular system: S1 & S2 heard, RRR. No JVD, murmurs, rubs, gallops or clicks. No pedal edema. Gastrointestinal system: Abdomen is nondistended, soft and nontender. No organomegaly or masses felt. Normal bowel sounds heard. Central nervous system: Alert and oriented. No focal neurological deficits. Extremities: Symmetric 5 x 5 power. Skin: No rashes, lesions or ulcers Psychiatry: Judgement and insight appear normal. Mood & affect appropriate.     Data Reviewed: I have personally reviewed following labs and imaging studies  CBC: Recent Labs  Lab 09/15/21 1631 09/16/21 0459 09/17/21 0526 09/18/21 0546 09/20/21 0541  WBC 16.0* 8.1 6.7 5.3 9.1  HGB 18.0* 16.2 14.2 13.6 13.6  HCT 52.2* 47.6 41.9 41.7 40.9  MCV 77.1* 75.7* 75.4* 76.4* 78.1*  PLT 331 291 229 234 [id number]   Basic Metabolic Panel: Recent Labs  Lab 09/15/21 1631 09/16/21 0459 09/17/21 0526 09/18/21 0546 09/20/21 0541  NA 129* 133* 132* 137 137  K 4.1 4.5 3.4* 3.9 3.4*  CL 91* 98 98 102 101  CO2 19* 28 26 27 30   GLUCOSE 153* 133* 117* 102* 119*  BUN 49* 45* 15 9 7*  CREATININE 3.05* 1.79* 0.98 0.79 0.91  CALCIUM 8.4* 8.5* 8.0* 8.2* 8.3*  MG  --   --   2.0 1.9 1.5*   GFR: Estimated Creatinine Clearance: 80.9 mL/min (by C-G formula based on SCr of 0.91 mg/dL). Liver Function Tests: Recent Labs  Lab 09/15/21 1631  AST 27  ALT 19  ALKPHOS 53  BILITOT 0.9  PROT 7.0  ALBUMIN 3.3*   Recent Labs  Lab 09/15/21 1631  LIPASE 19   No results for input(s): AMMONIA in the last 168 hours. Coagulation Profile: No results for input(s): INR, PROTIME in the last 168 hours. Cardiac Enzymes: No results for input(s): CKTOTAL, CKMB, CKMBINDEX, TROPONINI in the last 168 hours. BNP (last 3 results) No results for input(s): PROBNP in the last 8760 hours. HbA1C: No results for input(s): HGBA1C in the last 72 hours. CBG: No results for input(s): GLUCAP in the last 168 hours. Lipid Profile: No results for input(s): CHOL, HDL, LDLCALC, TRIG, CHOLHDL, LDLDIRECT in the last 72 hours. Thyroid Function Tests: No results for input(s): TSH, T4TOTAL, FREET4, T3FREE, THYROIDAB in the last 72 hours. Anemia Panel: No results for input(s): VITAMINB12, FOLATE, FERRITIN, TIBC, IRON, RETICCTPCT in the last 72 hours. Sepsis Labs: Recent Labs  Lab 09/16/21 0211  LATICACIDVEN 2.9*    Recent Results (from the past 240 hour(s))  Resp Panel by RT-PCR (Flu A&B, Covid) Nasopharyngeal Swab  Status: None   Collection Time: 09/15/21  8:11 PM   Specimen: Nasopharyngeal Swab; Nasopharyngeal(NP) swabs in vial transport medium  Result Value Ref Range Status   SARS Coronavirus 2 by RT PCR NEGATIVE NEGATIVE Final    Comment: (NOTE) SARS-CoV-2 target nucleic acids are NOT DETECTED.  The SARS-CoV-2 RNA is generally detectable in upper respiratory specimens during the acute phase of infection. The lowest concentration of SARS-CoV-2 viral copies this assay can detect is 138 copies/mL. A negative result does not preclude SARS-Cov-2 infection and should not be used as the sole basis for treatment or other patient management decisions. A negative result may occur with   improper specimen collection/handling, submission of specimen other than nasopharyngeal swab, presence of viral mutation(s) within the areas targeted by this assay, and inadequate number of viral copies(<138 copies/mL). A negative result must be combined with clinical observations, patient history, and epidemiological information. The expected result is Negative.  Fact Sheet for Patients:  EntrepreneurPulse.com.au  Fact Sheet for Healthcare Providers:  IncredibleEmployment.be  This test is no t yet approved or cleared by the [place] FDA and  has been authorized for detection and/or diagnosis of SARS-CoV-2 by FDA under an Emergency Use Authorization (EUA). This EUA will remain  in effect (meaning this test can be used) for the duration of the COVID-19 declaration under Section 564(b)(1) of the Act, 21 U.S.C.section 360bbb-3(b)(1), unless the authorization is terminated  or revoked sooner.       Influenza A by PCR NEGATIVE NEGATIVE Final   Influenza B by PCR NEGATIVE NEGATIVE Final    Comment: (NOTE) The Xpert Xpress SARS-CoV-2/FLU/RSV plus assay is intended as an aid in the diagnosis of influenza from Nasopharyngeal swab specimens and should not be used as a sole basis for treatment. Nasal washings and aspirates are unacceptable for Xpert Xpress SARS-CoV-2/FLU/RSV testing.  Fact Sheet for Patients: EntrepreneurPulse.com.au  Fact Sheet for Healthcare Providers: IncredibleEmployment.be  This test is not yet approved or cleared by the [place] FDA and has been authorized for detection and/or diagnosis of SARS-CoV-2 by FDA under an Emergency Use Authorization (EUA). This EUA will remain in effect (meaning this test can be used) for the duration of the COVID-19 declaration under Section 564(b)(1) of the Act, 21 U.S.C. section 360bbb-3(b)(1), unless the authorization is terminated  or revoked.  Performed at [facility], [address] [place], [place] [address]   Gastrointestinal Panel by PCR , Stool     Status: Abnormal   Collection Time: 09/16/21  1:11 AM   Specimen: Stool  Result Value Ref Range Status   Campylobacter species NOT DETECTED NOT DETECTED Final   Plesimonas shigelloides NOT DETECTED NOT DETECTED Final   Salmonella species NOT DETECTED NOT DETECTED Final   Yersinia enterocolitica NOT DETECTED NOT DETECTED Final   Vibrio species NOT DETECTED NOT DETECTED Final   Vibrio cholerae NOT DETECTED NOT DETECTED Final   Enteroaggregative E coli (EAEC) NOT DETECTED NOT DETECTED Final   Enteropathogenic E coli (EPEC) NOT DETECTED NOT DETECTED Final   Enterotoxigenic E coli (ETEC) NOT DETECTED NOT DETECTED Final   Shiga like toxin producing E coli (STEC) NOT DETECTED NOT DETECTED Final   Shigella/Enteroinvasive E coli (EIEC) DETECTED (A) NOT DETECTED Final    Comment: CRITICAL RESULT CALLED TO, READ BACK BY AND VERIFIED WITHEarlie Server @ [id number] 09/16/21 LFD    Cryptosporidium NOT DETECTED NOT DETECTED Final   Cyclospora cayetanensis NOT DETECTED NOT DETECTED Final   Entamoeba histolytica NOT DETECTED NOT  DETECTED Final   Giardia lamblia NOT DETECTED NOT DETECTED Final   Adenovirus F40/41 NOT DETECTED NOT DETECTED Final   Astrovirus NOT DETECTED NOT DETECTED Final   Norovirus GI/GII NOT DETECTED NOT DETECTED Final   Rotavirus A NOT DETECTED NOT DETECTED Final   Sapovirus (I, II, IV, and V) NOT DETECTED NOT DETECTED Final    Comment: Performed at [facility], [address] [place], [place] [address]  C Difficile Quick Screen w PCR reflex     Status: None   Collection Time: 09/16/21  1:11 AM   Specimen: Stool  Result Value Ref Range Status   C Diff antigen NEGATIVE NEGATIVE Final   C Diff toxin NEGATIVE NEGATIVE Final   C Diff interpretation No C. difficile detected.  Final    Comment: Performed at [facility],  [address] [place], [place] [address]  Stool culture (children & immunocomp patients)     Status: None   Collection Time: 09/16/21 11:25 AM   Specimen: Stool  Result Value Ref Range Status   Salmonella/Shigella Screen [facility]  Final    Comment: (NOTE) Test not performed. The required specimen for the test ordered was not received.      Received Room temp stool      Requires P/P Raymond Gurney D notified 09/17/2021    Campylobacter Culture [facility]  Final    Comment: (NOTE) Test not performed. The required specimen for the test ordered was not received.      Received Room temp stool      Requires P/P Raymond Gurney D notified 09/17/2021    E coli, Shiga toxin Assay [facility]  Final    Comment: (NOTE) Test not performed. The required specimen for the test ordered was not received.      Received Room temp stool      Requires P/P Raymond Gurney D notified 09/17/2021 Performed At: [facility] [place], [place] [id number] Rush Farmer MD [id number]          Radiology Studies: No results found.      Scheduled Meds:  amphetamine-dextroamphetamine  30 mg Oral BID WC   azithromycin  500 mg Oral Daily   enoxaparin (LOVENOX) injection  40 mg Subcutaneous Q24H   potassium chloride  40 mEq Oral BID   Continuous Infusions:  sodium chloride 75 mL/hr at 09/20/21 0326     LOS: 4 days    Time spent: 34 minutes    Barb Merino, MD [facility] Pager [phone]

## 2021-09-21 LAB — BASIC METABOLIC PANEL
Anion gap: 5 (ref 5–15)
BUN: 10 mg/dL (ref 8–23)
CO2: 28 mmol/L (ref 22–32)
Calcium: 7.8 mg/dL — ABNORMAL LOW (ref 8.9–10.3)
Chloride: 105 mmol/L (ref 98–111)
Creatinine, Ser: 0.71 mg/dL (ref 0.61–1.24)
GFR, Estimated: >60 mL/min (ref >60)
Glucose, Bld: 116 mg/dL — ABNORMAL HIGH (ref 70–99)
Potassium: 3.3 mmol/L — ABNORMAL LOW (ref 3.5–5.1)
Sodium: 138 mmol/L (ref 135–145)

## 2021-09-21 LAB — MAGNESIUM: Magnesium: 2 mg/dL (ref 1.7–2.4)

## 2021-09-21 LAB — PHOSPHORUS: Phosphorus: 3.4 mg/dL (ref 2.5–4.6)

## 2021-09-21 MED ORDER — AZITHROMYCIN 500 MG PO TABS
500.0000 mg | ORAL_TABLET | Freq: Every day | ORAL | 0 refills | Status: AC
Start: 2021-09-21 — End: 2021-09-24

## 2021-09-21 MED ORDER — POTASSIUM CHLORIDE CRYS ER 20 MEQ PO TBCR: 40.0000 meq | EXTENDED_RELEASE_TABLET | Freq: Every day | ORAL | 0 refills | Status: AC

## 2021-09-21 NOTE — Progress Notes (Signed)
MD order received in [facility] to discharge pt home today; verbally reviewed AVS with pt; no questions voiced by the pt at this time; pt verbalized that he wants to wait until after lunch to see how his lunch digests before he leaves; verbally instructed pt to call out when he is ready to go in order for a volunteer to come with a wheelchair for discharge

## 2021-09-21 NOTE — Plan of Care (Signed)
  Problem: Education: Goal: Knowledge of General Education information will improve Description: Including pain rating scale, medication(s)/side effects and non-pharmacologic comfort measures Outcome: Adequate for Discharge   Problem: Health Behavior/Discharge Planning: Goal: Ability to manage health-related needs will improve Outcome: Adequate for Discharge   Problem: Clinical Measurements: Goal: Ability to maintain clinical measurements within normal limits will improve Outcome: Adequate for Discharge Goal: Will remain free from infection Outcome: Adequate for Discharge Goal: Diagnostic test results will improve Outcome: Adequate for Discharge Goal: Respiratory complications will improve Outcome: Adequate for Discharge Goal: Cardiovascular complication will be avoided Outcome: Adequate for Discharge   Problem: Activity: Goal: Risk for activity intolerance will decrease Outcome: Adequate for Discharge   Problem: Nutrition: Goal: Adequate nutrition will be maintained Outcome: Adequate for Discharge   Problem: Coping: Goal: Level of anxiety will decrease Outcome: Adequate for Discharge   Problem: Elimination: Goal: Will not experience complications related to bowel motility Outcome: Adequate for Discharge Goal: Will not experience complications related to urinary retention Outcome: Adequate for Discharge   Problem: Pain Managment: Goal: General experience of comfort will improve Outcome: Adequate for Discharge   Problem: Safety: Goal: Ability to remain free from injury will improve Outcome: Adequate for Discharge   Problem: Skin Integrity: Goal: Risk for impaired skin integrity will decrease Outcome: Adequate for Discharge   Problem: Education: Goal: Knowledge of disease and its progression will improve Outcome: Adequate for Discharge   Problem: Activity: Goal: Activity intolerance will improve Outcome: Adequate for Discharge   Problem: Fluid Volume: Goal:  Fluid volume balance will be maintained or improved Outcome: Adequate for Discharge   Problem: Respiratory: Goal: Respiratory symptoms related to disease process will be avoided Outcome: Adequate for Discharge   Problem: Self-Concept: Goal: Body image disturbance will be avoided or minimized Outcome: Adequate for Discharge

## 2021-09-21 NOTE — Plan of Care (Signed)
  Problem: Health Behavior/Discharge Planning: Goal: Ability to manage health-related needs will improve Outcome: Progressing   Problem: Clinical Measurements: Goal: Will remain free from infection Outcome: Progressing   Problem: Clinical Measurements: Goal: Cardiovascular complication will be avoided Outcome: Progressing   Problem: Activity: Goal: Risk for activity intolerance will decrease Outcome: Progressing   Problem: Coping: Goal: Level of anxiety will decrease Outcome: Progressing

## 2021-09-21 NOTE — Progress Notes (Signed)
Pt ready for pt discharge; pt discharged via wheelchair by a volunteer [facility]

## 2021-09-21 NOTE — Progress Notes (Signed)
ID Pt doing much better No loose stools since this morning Ate breakfast No nausea o pain abdomen  Patient Vitals for the past 24 hrs:  BP Temp Temp src Pulse Resp SpO2  09/21/21 1147 127/71 97.8 F (36.6 C) -- 98 16 100 %  09/21/21 0750 124/78 98.6 F (37 C) -- 96 18 99 %  09/21/21 0519 121/76 98 F (36.7 C) -- 97 19 100 %  09/21/21 0025 103/62 97.7 F (36.5 C) Oral (!) 103 16 97 %  09/20/21 1946 121/80 98.3 F (36.8 C) Oral (!) 102 18 100 %  09/20/21 1601 (!) 144/84 98.4 F (36.9 C) -- (!) 102 18 100 %    Awake and alert [id number] Chest CT And soft CNS non focal  Labs CBC Latest Ref Rng & Units 09/20/2021 09/18/2021 09/17/2021  WBC 4.0 - 10.5 K/uL 9.1 5.3 6.7  Hemoglobin 13.0 - 17.0 g/dL 10.9 32.3 55.7  Hematocrit 39.0 - 52.0 % 40.9 41.7 41.9  Platelets 150 - 400 K/uL 300 234 229    CMP Latest Ref Rng & Units 09/21/2021 09/20/2021 09/18/2021  Glucose 70 - 99 mg/dL 322(G) 254(Y) 706(C)  BUN 8 - 23 mg/dL 10 7(L) 9  Creatinine 3.76 - 1.24 mg/dL 2.83 1.51 7.61  Sodium 135 - 145 mmol/L 138 137 137  Potassium 3.5 - 5.1 mmol/L 3.3(L) 3.4(L) 3.9  Chloride 98 - 111 mmol/L 105 101 102  CO2 22 - 32 mmol/L 28 30 27   Calcium 8.9 - 10.3 mg/dL 7.8(L) 8.3(L) 8.2(L)  Total Protein 6.5 - 8.1 g/dL - - -  Total Bilirubin 0.3 - 1.2 mg/dL - - -  Alkaline Phos 38 - 126 U/L - - -  AST 15 - 41 U/L - - -  ALT 0 - 44 U/L - - -     Impression/recommendation  Infectious colitis due to enteroinvasive e.coli/shigella with severe diarrhea Was initially on zosyn and then ceftriaxone- diarrhea persisited and was switched to azithromycin on 09/19/21 and he is much improved since then. He needs 2 more doses of azithromycin  Hypokalemia/hypomagnesemia being corrected  AKI- resolved  Leucocytosis resolved  HV NR  Discussed the management with patient, his friend and hospitalist He is being discharged today

## 2021-09-21 NOTE — Discharge Summary (Signed)
Physician Discharge Summary  Victor Berger [id number] DOB: [birth year] DOA: 09/16/2021  PCP: [facility]  Admit date: 09/16/2021 Discharge date: 09/21/2021  Admitted From: Home Disposition:  Home  Discharge Condition:Stable CODE STATUS:FULL Diet recommendation: Regular   Brief/Interim Summary: Victor Berger is a 65 year old male with past medical history significant for ADHD who presented to [facility] ED on 10/28 with persistent nausea/vomiting, diarrhea x2 days.  Recently returned from trip to [place] with several individuals developing similar symptoms.  Patient also reports diarrhea progressively worsening now with streaks of blood and abdominal cramping.  Denies fever/chills.  No recent changes in his medications, no changes in dietary habits..  CT abdomen/pelvis with suspected infectious/inflammatory colitis involving ascending colon and rectosigmoid colon without pneumatosis, no drainable fluid/abscess or free air.  Patient was started on IV Zosyn.  TRH consulted for further evaluation and management of infectious diarrhea.  GI pathogen panel showed Shigella/enteroinvasive E. coli.  ID was also following.  Patient's diarrhea has slowly improved.  He currently denies any abdominal pain, fever, bloody stools.  AKI has resolved.  Patient is medically stable for discharge today with oral antibiotic.  Following problems were addressed during his hospitalization:   Shigella/Enteroinvasive E. coli colitis. Lactic acidosis Patient presenting to the ED with progressive nausea/vomiting, diarrhea over the last 2 days.  Recently returned from a trip to [place] with several individuals with similar symptoms.  Patient was afebrile with elevated WBC count of 16.0 and a lactic acid of 2.9.  CT abdomen/pelvis with suspected infectious/inflammatory colitis involving ascending colon and rectosigmoid colon without pneumatosis, no drainable fluid/abscess or free air.  C. difficile PCR  negative.  GI PCR positive for Shigella/enteroinvasive E. coli. --WBC normalized. -No fever or bloody stools. -Continue azithromycin for 3 more days. -Continue food like bread, rice, yogurt, banana   Acute renal failure: resolved High anion gap metabolic acidosis: Resolved Patient presenting with creatinine 3.05; no baseline available for review in EMR.  No previous history of CKD reported.  Etiology likely secondary to prerenal azotemia in the setting of severe dehydration from diarrhea E. coli colitis as above. -- Kidney function has normalized   Hyponatremia: resolved Sodium 129 on admission, likely secondary to hypovolemia, in the setting of dehydration as above.  Resolved  ADHD: Continue Adderall 30mg  BID   Hypokalemia: Continue supplementation at home.  Check BMP in a week.          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 2)Follow up with your PCP in a week.  Do a BMP test during the follow-up 3)Take food like bread, rice, yogurt, banana instead of macoroni/cheese/cake etc   Increase activity slowly   Complete by: As directed       Allergies as of 09/21/2021   No Known Allergies      Medication List     TAKE these medications    amphetamine-dextroamphetamine 30 MG tablet Commonly known as: ADDERALL Take 60 mg by mouth daily.   azithromycin 500 MG tablet Commonly known as: ZITHROMAX Take 1 tablet (500 mg total) by mouth daily for 3  days.   potassium chloride SA 20 MEQ tablet Commonly known as: KLOR-CON Take 2 tablets (40 mEq total) by mouth daily.        Follow-up  Information     [facility]. Schedule an appointment as soon as possible for a visit in 1 week(s).   Contact information: [place] [address] [phone]                No Known Allergies  Consultations: ID   Procedures/Studies: CT ABDOMEN PELVIS WO CONTRAST  Result Date: 09/16/2021 CLINICAL DATA:  Vomiting, diarrhea EXAM: CT ABDOMEN AND PELVIS WITHOUT CONTRAST TECHNIQUE: Multidetector CT imaging of the abdomen and pelvis was performed following the standard protocol without IV contrast. COMPARISON:  11/12/2011. FINDINGS: Lower chest: Lung bases are clear. Hepatobiliary: Unenhanced liver is unremarkable. Mild layering gallbladder sludge (series 2/image 33). No intrahepatic or extrahepatic ductal dilatation. Pancreas: Within normal limits. Spleen: Within normal limits. Adrenals/Urinary Tract: Adrenal glands are within normal limits. Kidneys are within normal limits. No renal calculi or hydronephrosis. Mildly thick-walled bladder, although underdistended. Stomach/Bowel: Stomach is notable for a tiny hiatal hernia. No evidence of bowel obstruction. Normal appendix (series 2/image 65). Long segment wall thickening involving the ascending colon, mildly masslike (series 2/image 63), but favoring colitis given the long segment involvement. Mild pericolonic inflammation without pneumatosis, drainable fluid collection/abscess, or free air. Additional long segment wall thickening with a haustra appearance of the rectosigmoid colon, most prominent in the lower rectum (series 2/image 82), also suggesting infectious/inflammatory colitis. Vascular/Lymphatic: No evidence of abdominal aortic aneurysm. No suspicious abdominopelvic lymphadenopathy. Reproductive: Prostate is unremarkable. Other: No abdominopelvic ascites. Small fat containing left inguinal hernia. Moderate fat containing right inguinal hernia containing trace fluid. Musculoskeletal: Mild degenerative  changes of the visualized thoracolumbar spine. IMPRESSION: Suspected infectious/inflammatory colitis involving the ascending colon and rectosigmoid colon. No pneumatosis, drainable fluid collection/abscess, or free air. Although this appearance is not considered highly suspicious for underlying mass, follow-up colonoscopy is suggested. Electronically Signed   By: Julian Hy M.D.   On: 09/16/2021 01:11      Subjective: Patient seen and examined at the bedside this morning.  Hemodynamically stable for discharge today.  Discharge Exam: Vitals:   09/21/21 0519 09/21/21 0750  BP: 121/76 124/78  Pulse: 97 96  Resp: 19 18  Temp: 98 F (36.7 C) 98.6 F (37 C)  SpO2: 100% 99%   Vitals:   09/20/21 1946 09/21/21 0025 09/21/21 0519 09/21/21 0750  BP: 121/80 103/62 121/76 124/78  Pulse: (!) 102 (!) 103 97 96  Resp: 18 16 19 18   Temp: 98.3 F (36.8 C) 97.7 F (36.5 C) 98 F (36.7 C) 98.6 F (37 C)  TempSrc: Oral Oral    SpO2: 100% 97% 100% 99%  Weight:      Height:        General: Pt is alert, awake, not in acute distress Cardiovascular: RRR, S1/S2 +, no rubs, no gallops Respiratory: CTA bilaterally, no wheezing, no rhonchi Abdominal: Soft, NT, ND, bowel sounds + Extremities: no edema, no cyanosis    The results of significant diagnostics from this hospitalization (including imaging, microbiology, ancillary and laboratory) are listed below for reference.     Microbiology: Recent Results (from the past 240 hour(s))  Resp Panel by RT-PCR (Flu A&B, Covid) Nasopharyngeal Swab     Status: None   Collection Time: 09/15/21  8:11 PM   Specimen: Nasopharyngeal Swab; Nasopharyngeal(NP) swabs in vial transport medium  Result Value  Ref Range Status   SARS Coronavirus 2 by RT PCR NEGATIVE NEGATIVE Final    Comment: (NOTE) SARS-CoV-2 target nucleic acids are NOT DETECTED.  The SARS-CoV-2 RNA is generally detectable in upper respiratory specimens during the acute phase of infection.  The lowest concentration of SARS-CoV-2 viral copies this assay can detect is 138 copies/mL. A negative result does not preclude SARS-Cov-2 infection and should not be used as the sole basis for treatment or other patient management decisions. A negative result may occur with  improper specimen collection/handling, submission of specimen other than nasopharyngeal swab, presence of viral mutation(s) within the areas targeted by this assay, and inadequate number of viral copies(<138 copies/mL). A negative result must be combined with clinical observations, patient history, and epidemiological information. The expected result is Negative.  Fact Sheet for Patients:  EntrepreneurPulse.com.au  Fact Sheet for Healthcare Providers:  IncredibleEmployment.be  This test is no t yet approved or cleared by the [place] FDA and  has been authorized for detection and/or diagnosis of SARS-CoV-2 by FDA under an Emergency Use Authorization (EUA). This EUA will remain  in effect (meaning this test can be used) for the duration of the COVID-19 declaration under Section 564(b)(1) of the Act, 21 U.S.C.section 360bbb-3(b)(1), unless the authorization is terminated  or revoked sooner.       Influenza A by PCR NEGATIVE NEGATIVE Final   Influenza B by PCR NEGATIVE NEGATIVE Final    Comment: (NOTE) The Xpert Xpress SARS-CoV-2/FLU/RSV plus assay is intended as an aid in the diagnosis of influenza from Nasopharyngeal swab specimens and should not be used as a sole basis for treatment. Nasal washings and aspirates are unacceptable for Xpert Xpress SARS-CoV-2/FLU/RSV testing.  Fact Sheet for Patients: EntrepreneurPulse.com.au  Fact Sheet for Healthcare Providers: IncredibleEmployment.be  This test is not yet approved or cleared by the [place] FDA and has been authorized for detection and/or diagnosis of SARS-CoV-2 by FDA  under an Emergency Use Authorization (EUA). This EUA will remain in effect (meaning this test can be used) for the duration of the COVID-19 declaration under Section 564(b)(1) of the Act, 21 U.S.C. section 360bbb-3(b)(1), unless the authorization is terminated or revoked.  Performed at [facility], [address] [place], [place] [address]   Gastrointestinal Panel by PCR , Stool     Status: Abnormal   Collection Time: 09/16/21  1:11 AM   Specimen: Stool  Result Value Ref Range Status   Campylobacter species NOT DETECTED NOT DETECTED Final   Plesimonas shigelloides NOT DETECTED NOT DETECTED Final   Salmonella species NOT DETECTED NOT DETECTED Final   Yersinia enterocolitica NOT DETECTED NOT DETECTED Final   Vibrio species NOT DETECTED NOT DETECTED Final   Vibrio cholerae NOT DETECTED NOT DETECTED Final   Enteroaggregative E coli (EAEC) NOT DETECTED NOT DETECTED Final   Enteropathogenic E coli (EPEC) NOT DETECTED NOT DETECTED Final   Enterotoxigenic E coli (ETEC) NOT DETECTED NOT DETECTED Final   Shiga like toxin producing E coli (STEC) NOT DETECTED NOT DETECTED Final   Shigella/Enteroinvasive E coli (EIEC) DETECTED (A) NOT DETECTED Final    Comment: CRITICAL RESULT CALLED TO, READ BACK BY AND VERIFIED WITH: Earlie Server @ [id number] 09/16/21 LFD    Cryptosporidium NOT DETECTED NOT DETECTED Final   Cyclospora cayetanensis NOT DETECTED NOT DETECTED Final   Entamoeba histolytica NOT DETECTED NOT DETECTED Final   Giardia lamblia NOT DETECTED NOT DETECTED Final   Adenovirus F40/41 NOT DETECTED NOT DETECTED Final   Astrovirus NOT  DETECTED NOT DETECTED Final   Norovirus GI/GII NOT DETECTED NOT DETECTED Final   Rotavirus A NOT DETECTED NOT DETECTED Final   Sapovirus (I, II, IV, and V) NOT DETECTED NOT DETECTED Final    Comment: Performed at [facility], [place], [place] [address]  C Difficile Quick Screen w PCR reflex     Status: None   Collection  Time: 09/16/21  1:11 AM   Specimen: Stool  Result Value Ref Range Status   C Diff antigen NEGATIVE NEGATIVE Final   C Diff toxin NEGATIVE NEGATIVE Final   C Diff interpretation No C. difficile detected.  Final    Comment: Performed at [facility], [address] [place], [place] [address]  Stool culture (children & immunocomp patients)     Status: None   Collection Time: 09/16/21 11:25 AM   Specimen: Stool  Result Value Ref Range Status   Salmonella/Shigella Screen [facility]  Final    Comment: (NOTE) Test not performed. The required specimen for the test ordered was not received.      Received Room temp stool      Requires P/P Raymond Gurney D notified 09/17/2021    Campylobacter Culture [facility]  Final    Comment: (NOTE) Test not performed. The required specimen for the test ordered was not received.      Received Room temp stool      Requires P/P Raymond Gurney D notified 09/17/2021    E coli, Shiga toxin Assay [facility]  Final    Comment: (NOTE) Test not performed. The required specimen for the test ordered was not received.      Received Room temp stool      Requires P/P Raymond Gurney D notified 09/17/2021 Performed At: [facility] [place], [place] [id number] Rush Farmer MD [id number]      Labs: BNP (last 3 results) No results for input(s): BNP in the last 8760 hours. Basic Metabolic Panel: Recent Labs  Lab 09/16/21 0459 09/17/21 0526 09/18/21 0546 09/20/21 0541 09/21/21 0500  NA 133* 132* 137 137 138  K 4.5 3.4* 3.9 3.4* 3.3*  CL 98 98 102 101 105  CO2 28 26 27 30 28   GLUCOSE 133* 117* 102* 119* 116*  BUN 45* 15 9 7* 10  CREATININE 1.79* 0.98 0.79 0.91 0.71  CALCIUM 8.5* 8.0* 8.2* 8.3* 7.8*  MG  --  2.0 1.9 1.5* 2.0  PHOS  --   --   --   --  3.4   Liver Function Tests: Recent Labs  Lab 09/15/21 1631  AST 27  ALT 19  ALKPHOS 53  BILITOT 0.9  PROT 7.0  ALBUMIN 3.3*   Recent Labs  Lab  09/15/21 1631  LIPASE 19   No results for input(s): AMMONIA in the last 168 hours. CBC: Recent Labs  Lab 09/15/21 1631 09/16/21 0459 09/17/21 0526 09/18/21 0546 09/20/21 0541  WBC 16.0* 8.1 6.7 5.3 9.1  HGB 18.0* 16.2 14.2 13.6 13.6  HCT 52.2* 47.6 41.9 41.7 40.9  MCV 77.1* 75.7* 75.4* 76.4* 78.1*  PLT 331 291 229 234 300   Cardiac Enzymes: No results for input(s): CKTOTAL, CKMB, CKMBINDEX, TROPONINI in the last 168 hours. BNP: Invalid input(s): POCBNP CBG: No results for input(s): GLUCAP in the last 168 hours. D-Dimer No results for input(s): DDIMER in the last 72 hours. Hgb A1c No results for input(s): HGBA1C  in the last 72 hours. Lipid Profile No results for input(s): CHOL, HDL, LDLCALC, TRIG, CHOLHDL, LDLDIRECT in the last 72 hours. Thyroid function studies No results for input(s): TSH, T4TOTAL, T3FREE, THYROIDAB in the last 72 hours.  Invalid input(s): FREET3 Anemia work up No results for input(s): VITAMINB12, FOLATE, FERRITIN, TIBC, IRON, RETICCTPCT in the last 72 hours. Urinalysis    Component Value Date/Time   COLORURINE YELLOW 11/12/2011 0444   APPEARANCEUR TURBID (A) 11/12/2011 0444   LABSPEC 1.029 11/12/2011 0444   PHURINE 5.5 11/12/2011 0444   GLUCOSEU NEGATIVE 11/12/2011 0444   HGBUR MODERATE (A) 11/12/2011 0444   BILIRUBINUR NEGATIVE 11/12/2011 0444   KETONESUR NEGATIVE 11/12/2011 0444   PROTEINUR NEGATIVE 11/12/2011 0444   UROBILINOGEN 1.0 11/12/2011 0444   NITRITE NEGATIVE 11/12/2011 0444   LEUKOCYTESUR NEGATIVE 11/12/2011 0444   Sepsis Labs Invalid input(s): PROCALCITONIN,  WBC,  LACTICIDVEN Microbiology Recent Results (from the past 240 hour(s))  Resp Panel by RT-PCR (Flu A&B, Covid) Nasopharyngeal Swab     Status: None   Collection Time: 09/15/21  8:11 PM   Specimen: Nasopharyngeal Swab; Nasopharyngeal(NP) swabs in vial transport medium  Result Value Ref Range Status   SARS Coronavirus 2 by RT PCR NEGATIVE NEGATIVE Final    Comment:  (NOTE) SARS-CoV-2 target nucleic acids are NOT DETECTED.  The SARS-CoV-2 RNA is generally detectable in upper respiratory specimens during the acute phase of infection. The lowest concentration of SARS-CoV-2 viral copies this assay can detect is 138 copies/mL. A negative result does not preclude SARS-Cov-2 infection and should not be used as the sole basis for treatment or other patient management decisions. A negative result may occur with  improper specimen collection/handling, submission of specimen other than nasopharyngeal swab, presence of viral mutation(s) within the areas targeted by this assay, and inadequate number of viral copies(<138 copies/mL). A negative result must be combined with clinical observations, patient history, and epidemiological information. The expected result is Negative.  Fact Sheet for Patients:  EntrepreneurPulse.com.au  Fact Sheet for Healthcare Providers:  IncredibleEmployment.be  This test is no t yet approved or cleared by the [place] FDA and  has been authorized for detection and/or diagnosis of SARS-CoV-2 by FDA under an Emergency Use Authorization (EUA). This EUA will remain  in effect (meaning this test can be used) for the duration of the COVID-19 declaration under Section 564(b)(1) of the Act, 21 U.S.C.section 360bbb-3(b)(1), unless the authorization is terminated  or revoked sooner.       Influenza A by PCR NEGATIVE NEGATIVE Final   Influenza B by PCR NEGATIVE NEGATIVE Final    Comment: (NOTE) The Xpert Xpress SARS-CoV-2/FLU/RSV plus assay is intended as an aid in the diagnosis of influenza from Nasopharyngeal swab specimens and should not be used as a sole basis for treatment. Nasal washings and aspirates are unacceptable for Xpert Xpress SARS-CoV-2/FLU/RSV testing.  Fact Sheet for Patients: EntrepreneurPulse.com.au  Fact Sheet for Healthcare  Providers: IncredibleEmployment.be  This test is not yet approved or cleared by the [place] FDA and has been authorized for detection and/or diagnosis of SARS-CoV-2 by FDA under an Emergency Use Authorization (EUA). This EUA will remain in effect (meaning this test can be used) for the duration of the COVID-19 declaration under Section 564(b)(1) of the Act, 21 U.S.C. section 360bbb-3(b)(1), unless the authorization is terminated or revoked.  Performed at [facility], [address], [place], [place] [address]   Gastrointestinal Panel by PCR , Stool     Status: Abnormal  Collection Time: 09/16/21  1:11 AM   Specimen: Stool  Result Value Ref Range Status   Campylobacter species NOT DETECTED NOT DETECTED Final   Plesimonas shigelloides NOT DETECTED NOT DETECTED Final   Salmonella species NOT DETECTED NOT DETECTED Final   Yersinia enterocolitica NOT DETECTED NOT DETECTED Final   Vibrio species NOT DETECTED NOT DETECTED Final   Vibrio cholerae NOT DETECTED NOT DETECTED Final   Enteroaggregative E coli (EAEC) NOT DETECTED NOT DETECTED Final   Enteropathogenic E coli (EPEC) NOT DETECTED NOT DETECTED Final   Enterotoxigenic E coli (ETEC) NOT DETECTED NOT DETECTED Final   Shiga like toxin producing E coli (STEC) NOT DETECTED NOT DETECTED Final   Shigella/Enteroinvasive E coli (EIEC) DETECTED (A) NOT DETECTED Final    Comment: CRITICAL RESULT CALLED TO, READ BACK BY AND VERIFIED WITH: Earlie Server @ 0308 09/16/21 LFD    Cryptosporidium NOT DETECTED NOT DETECTED Final   Cyclospora cayetanensis NOT DETECTED NOT DETECTED Final   Entamoeba histolytica NOT DETECTED NOT DETECTED Final   Giardia lamblia NOT DETECTED NOT DETECTED Final   Adenovirus F40/41 NOT DETECTED NOT DETECTED Final   Astrovirus NOT DETECTED NOT DETECTED Final   Norovirus GI/GII NOT DETECTED NOT DETECTED Final   Rotavirus A NOT DETECTED NOT DETECTED Final   Sapovirus (I, II, IV, and  V) NOT DETECTED NOT DETECTED Final    Comment: Performed at [facility], [address] [place], [place] [address]  C Difficile Quick Screen w PCR reflex     Status: None   Collection Time: 09/16/21  1:11 AM   Specimen: Stool  Result Value Ref Range Status   C Diff antigen NEGATIVE NEGATIVE Final   C Diff toxin NEGATIVE NEGATIVE Final   C Diff interpretation No C. difficile detected.  Final    Comment: Performed at [facility], [address] [place], [place] [address]  Stool culture (children & immunocomp patients)     Status: None   Collection Time: 09/16/21 11:25 AM   Specimen: Stool  Result Value Ref Range Status   Salmonella/Shigella Screen [facility]  Final    Comment: (NOTE) Test not performed. The required specimen for the test ordered was not received.      Received Room temp stool      Requires P/P Raymond Gurney D notified 09/17/2021    Campylobacter Culture [facility]  Final    Comment: (NOTE) Test not performed. The required specimen for the test ordered was not received.      Received Room temp stool      Requires P/P Raymond Gurney D notified 09/17/2021    E coli, Shiga toxin Assay [facility]  Final    Comment: (NOTE) Test not performed. The required specimen for the test ordered was not received.      Received Room temp stool      Requires P/P Raymond Gurney D notified 09/17/2021 Performed At: [facility] [address] [place], [place] [id number] Rush Farmer MD [id number]     Please note: You were cared for by a hospitalist during your hospital stay. Once you are discharged, your primary care physician will handle any further medical issues. Please note that NO REFILLS for any discharge medications will be authorized once you are discharged, as it is imperative that you return to your primary care physician (or establish a relationship with a primary care physician if you do not have  one) for your post hospital  discharge needs so that they can reassess your need for medications and monitor your lab values.    Time coordinating discharge: 40 minutes  SIGNED:   Burnadette Pop, MD  [facility] 09/21/2021, 10:44 AM Pager [phone]  If 7PM-7AM, please contact night-coverage [email or web address] Password [email or web address]
